# Patient Record
Sex: Male | Born: 1975
Health system: Southern US, Community
[De-identification: ages and names within clinical notes are randomized; demographics above are authoritative.]

## PROBLEM LIST (undated history)

## (undated) DIAGNOSIS — N529 Male erectile dysfunction, unspecified: Secondary | ICD-10-CM

## (undated) DIAGNOSIS — K219 Gastro-esophageal reflux disease without esophagitis: Secondary | ICD-10-CM

## (undated) DIAGNOSIS — N200 Calculus of kidney: Secondary | ICD-10-CM

## (undated) HISTORY — DX: Gastro-esophageal reflux disease without esophagitis: K21.9

## (undated) HISTORY — DX: Male erectile dysfunction, unspecified: N52.9

## (undated) HISTORY — DX: Calculus of kidney: N20.0

---

## 2011-11-30 ENCOUNTER — Encounter: Payer: Self-pay | Admitting: Internal Medicine

## 2011-11-30 DIAGNOSIS — Z0000 Encounter for general adult medical examination without abnormal findings: Secondary | ICD-10-CM | POA: Insufficient documentation

## 2011-12-01 ENCOUNTER — Other Ambulatory Visit: Payer: Self-pay | Admitting: Internal Medicine

## 2011-12-01 ENCOUNTER — Ambulatory Visit (INDEPENDENT_AMBULATORY_CARE_PROVIDER_SITE_OTHER): Payer: Managed Care, Other (non HMO) | Admitting: Internal Medicine

## 2011-12-01 ENCOUNTER — Encounter: Payer: Self-pay | Admitting: Internal Medicine

## 2011-12-01 ENCOUNTER — Other Ambulatory Visit (INDEPENDENT_AMBULATORY_CARE_PROVIDER_SITE_OTHER): Payer: Managed Care, Other (non HMO)

## 2011-12-01 VITALS — BP 100/58 | HR 71 | Temp 97.0°F | Ht 70.0 in | Wt 204.5 lb

## 2011-12-01 DIAGNOSIS — Z0000 Encounter for general adult medical examination without abnormal findings: Secondary | ICD-10-CM

## 2011-12-01 DIAGNOSIS — N529 Male erectile dysfunction, unspecified: Secondary | ICD-10-CM

## 2011-12-01 DIAGNOSIS — K219 Gastro-esophageal reflux disease without esophagitis: Secondary | ICD-10-CM | POA: Insufficient documentation

## 2011-12-01 DIAGNOSIS — N200 Calculus of kidney: Secondary | ICD-10-CM | POA: Insufficient documentation

## 2011-12-01 HISTORY — DX: Male erectile dysfunction, unspecified: N52.9

## 2011-12-01 LAB — CBC WITH DIFFERENTIAL/PLATELET
Basophils Relative: 0.2 % (ref 0.0–3.0)
Eosinophils Absolute: 0.1 10*3/uL (ref 0.0–0.7)
Hemoglobin: 16.1 g/dL (ref 13.0–17.0)
MCHC: 34.6 g/dL (ref 30.0–36.0)
MCV: 89.8 fl (ref 78.0–100.0)
Monocytes Absolute: 0.4 10*3/uL (ref 0.1–1.0)
Neutro Abs: 4.1 10*3/uL (ref 1.4–7.7)
RBC: 5.2 Mil/uL (ref 4.22–5.81)

## 2011-12-01 LAB — URINALYSIS, ROUTINE W REFLEX MICROSCOPIC
Bilirubin Urine: NEGATIVE
Hgb urine dipstick: NEGATIVE
Total Protein, Urine: NEGATIVE
Urine Glucose: NEGATIVE

## 2011-12-01 LAB — BASIC METABOLIC PANEL
Calcium: 9.3 mg/dL (ref 8.4–10.5)
Chloride: 106 mEq/L (ref 96–112)
Creatinine, Ser: 1 mg/dL (ref 0.4–1.5)
Sodium: 142 mEq/L (ref 135–145)

## 2011-12-01 LAB — LIPID PANEL
LDL Cholesterol: 122 mg/dL — ABNORMAL HIGH (ref 0–99)
Total CHOL/HDL Ratio: 4
Triglycerides: 103 mg/dL (ref 0.0–149.0)

## 2011-12-01 LAB — HEPATIC FUNCTION PANEL
ALT: 17 U/L (ref 0–53)
Bilirubin, Direct: 0.2 mg/dL (ref 0.0–0.3)
Total Bilirubin: 1.3 mg/dL — ABNORMAL HIGH (ref 0.3–1.2)

## 2011-12-01 MED ORDER — TADALAFIL 20 MG PO TABS: 20.0000 mg | ORAL_TABLET | Freq: Every day | ORAL | Status: DC | PRN

## 2011-12-01 MED ORDER — SILDENAFIL CITRATE 100 MG PO TABS: 50.0000 mg | ORAL_TABLET | Freq: Every day | ORAL | Status: DC | PRN

## 2011-12-01 NOTE — Telephone Encounter (Signed)
Pt informed rx sent.

## 2011-12-01 NOTE — Assessment & Plan Note (Signed)
Overall doing well, age appropriate education and counseling updated, referrals for preventative services and immunizations addressed, dietary and smoking counseling addressed, most recent labs and ECG reviewed.  I have personally reviewed and have noted: 1) the patient's medical and social history 2) The pt's use of alcohol, tobacco, and illicit drugs 3) The patient's current medications and supplements 4) Functional ability including ADL's, fall risk, home safety risk, hearing and visual impairment 5) Diet and physical activities 6) Evidence for depression or mood disorder 7) The patient's height, weight, and BMI have been recorded in the chart I have made referrals, and provided counseling and education based on review of the above For labs today as well

## 2011-12-01 NOTE — Telephone Encounter (Signed)
Done per emr 

## 2011-12-01 NOTE — Telephone Encounter (Signed)
Pt says his insurance desire him to try cialis before viagra---[facility] [address]--pt ph#[phone]--pt is requesting cialis be sent to his pharmacy.

## 2011-12-01 NOTE — Assessment & Plan Note (Signed)
Worsening symptoms, ? Psychological component; for testosterone check, viagra prn,  to f/u any worsening symptoms or concerns

## 2011-12-01 NOTE — Patient Instructions (Signed)
Take all new medications as prescribed Please go to LAB in the Basement for the blood and/or urine tests to be done today You will be contacted by phone if any changes need to be made immediately.  Otherwise, you will receive a letter about your results with an explanation. Please return in 1 year for your yearly visit, or sooner if needed, with Lab testing done 3-5 days before

## 2011-12-01 NOTE — Progress Notes (Signed)
Subjective:    Patient ID: Kevin Gregory, male    DOB: 12-04-1975, 36 y.o.   MRN: [id number]  HPI  Here for wellness and f/u;  Overall doing ok;  Pt denies CP, worsening SOB, DOE, wheezing, orthopnea, PND, worsening LE edema, palpitations, dizziness or syncope.  Pt denies neurological change such as new Headache, facial or extremity weakness.  Pt denies polydipsia, polyuria, or low sugar symptoms. Pt states overall good compliance with treatment and medications, good tolerability, and trying to follow lower cholesterol diet.  Pt denies worsening depressive symptoms, suicidal ideation or panic. No fever, wt loss, night sweats, loss of appetite, or other constitutional symptoms.  Pt states good ability with ADL's, low fall risk, home safety reviewed and adequate, no significant changes in hearing or vision, and occasionally active with exercise.  Main new complaint is more persistent singnificant ED symtpoms over the past 6 mo.  Has not been checked for testosterone.  Wife died 2 yrs ago, now with new girlfriend, has some stress at work.  Has ad some counseling for wife death in the past.   Past Medical History  Diagnosis Date  . Kidney stones   . Nephrolithiasis   . GERD (gastroesophageal reflux disease)    History reviewed. No pertinent past surgical history.  reports that he has quit smoking. He has never used smokeless tobacco. He reports that he drinks alcohol. He reports that he does not use illicit drugs. family history includes Drug abuse in his maternal grandmother. No Known Allergies No current outpatient prescriptions on file prior to visit.   Review of Systems Review of Systems  Constitutional: Negative for diaphoresis, activity change, appetite change and unexpected weight change.  HENT: Negative for hearing loss, ear pain, facial swelling, mouth sores and neck stiffness.   Eyes: Negative for pain, redness and visual disturbance.  Respiratory: Negative for shortness of breath and  wheezing.   Cardiovascular: Negative for chest pain and palpitations.  Gastrointestinal: Negative for diarrhea, blood in stool, abdominal distention and rectal pain.  Genitourinary: Negative for hematuria, flank pain and decreased urine volume.  Musculoskeletal: Negative for myalgias and joint swelling.  Skin: Negative for color change and wound.  Neurological: Negative for syncope and numbness.  Hematological: Negative for adenopathy.  Psychiatric/Behavioral: Negative for hallucinations, self-injury, decreased concentration and agitation.      Objective:   Physical Exam BP 100/58  Pulse 71  Temp(Src) 97 F (36.1 C) (Oral)  Ht 5\' 10"  (1.778 m)  Wt 204 lb 8 oz (92.761 kg)  BMI 29.34 kg/m2  SpO2 96% Physical Exam  VS noted Constitutional: Pt is oriented to person, place, and time. Appears well-developed and well-nourished.  HENT:  Head: Normocephalic and atraumatic.  Right Ear: External ear normal.  Left Ear: External ear normal.  Nose: Nose normal.  Mouth/Throat: Oropharynx is clear and moist.  Eyes: Conjunctivae and EOM are normal. Pupils are equal, round, and reactive to light.  Neck: Normal range of motion. Neck supple. No JVD present. No tracheal deviation present.  Cardiovascular: Normal rate, regular rhythm, normal heart sounds and intact distal pulses.   Pulmonary/Chest: Effort normal and breath sounds normal.  Abdominal: Soft. Bowel sounds are normal. There is no tenderness.  Musculoskeletal: Normal range of motion. Exhibits no edema.  Lymphadenopathy:  Has no cervical adenopathy.  Neurological: Pt is alert and oriented to person, place, and time. Pt has normal reflexes. No cranial nerve deficit.  Skin: Skin is warm and dry. No rash noted.  Psychiatric:  Has  normal mood and affect. Behavior is normal. except some tenseness overall    Assessment & Plan:

## 2011-12-02 ENCOUNTER — Encounter: Payer: Self-pay | Admitting: Internal Medicine

## 2011-12-02 LAB — TESTOSTERONE, FREE, TOTAL, SHBG: Sex Hormone Binding: 24 nmol/L (ref 13–71)

## 2012-06-01 ENCOUNTER — Other Ambulatory Visit (INDEPENDENT_AMBULATORY_CARE_PROVIDER_SITE_OTHER): Payer: Managed Care, Other (non HMO)

## 2012-06-01 ENCOUNTER — Ambulatory Visit (INDEPENDENT_AMBULATORY_CARE_PROVIDER_SITE_OTHER): Payer: Managed Care, Other (non HMO) | Admitting: Internal Medicine

## 2012-06-01 ENCOUNTER — Encounter: Payer: Self-pay | Admitting: Internal Medicine

## 2012-06-01 VITALS — BP 108/72 | HR 69 | Temp 97.1°F | Ht 70.0 in | Wt 212.2 lb

## 2012-06-01 DIAGNOSIS — K5900 Constipation, unspecified: Secondary | ICD-10-CM

## 2012-06-01 DIAGNOSIS — R109 Unspecified abdominal pain: Secondary | ICD-10-CM

## 2012-06-01 DIAGNOSIS — K219 Gastro-esophageal reflux disease without esophagitis: Secondary | ICD-10-CM

## 2012-06-01 LAB — CBC WITH DIFFERENTIAL/PLATELET
Basophils Absolute: 0 10*3/uL (ref 0.0–0.1)
Eosinophils Absolute: 0.1 10*3/uL (ref 0.0–0.7)
HCT: 48.1 % (ref 39.0–52.0)
Hemoglobin: 16.2 g/dL (ref 13.0–17.0)
Lymphs Abs: 1.4 10*3/uL (ref 0.7–4.0)
MCHC: 33.8 g/dL (ref 30.0–36.0)
Neutro Abs: 4.3 10*3/uL (ref 1.4–7.7)
RDW: 12.3 % (ref 11.5–14.6)

## 2012-06-01 LAB — URINALYSIS, ROUTINE W REFLEX MICROSCOPIC
Bilirubin Urine: NEGATIVE
Ketones, ur: NEGATIVE
Leukocytes, UA: NEGATIVE
Urobilinogen, UA: 0.2 (ref 0.0–1.0)
pH: 7.5 (ref 5.0–8.0)

## 2012-06-01 LAB — HEPATIC FUNCTION PANEL
Alkaline Phosphatase: 81 U/L (ref 39–117)
Bilirubin, Direct: 0.2 mg/dL (ref 0.0–0.3)

## 2012-06-01 LAB — BASIC METABOLIC PANEL
Calcium: 9 mg/dL (ref 8.4–10.5)
GFR: 80.17 mL/min (ref >60.00)
Sodium: 139 mEq/L (ref 135–145)

## 2012-06-01 LAB — H. PYLORI ANTIBODY, IGG: H Pylori IgG: NEGATIVE

## 2012-06-01 NOTE — Patient Instructions (Addendum)
Continue all other medications as before Please go to LAB in the Basement for the blood and/or urine tests to be done today, including the H pylori You will be contacted by phone if any changes need to be made immediately.  Otherwise, you will receive a letter about your results with an explanation. Please remember to sign up for My Chart at your earliest convenience, as this will be important to you in the future with finding out test results.

## 2012-06-04 ENCOUNTER — Encounter: Payer: Self-pay | Admitting: Internal Medicine

## 2012-06-04 DIAGNOSIS — K5900 Constipation, unspecified: Secondary | ICD-10-CM | POA: Insufficient documentation

## 2012-06-04 NOTE — Assessment & Plan Note (Addendum)
Exam most c/w prob msk pain, mild reflux and constipation, but cant r/o other;  For now for lab eval including h pylori, cont same tx , consider otc miralax,  to f/u any worsening symptoms or concerns, tylenol prn

## 2012-06-04 NOTE — Assessment & Plan Note (Signed)
For otc prilosec prn,  to f/u any worsening symptoms or concerns 

## 2012-06-04 NOTE — Assessment & Plan Note (Signed)
Midl, for otc miralax prn,  to f/u any worsening symptoms or concerns

## 2012-06-04 NOTE — Progress Notes (Signed)
  Subjective:    Patient ID: Kevin Gregory, male    DOB: Oct 22, 1975, 36 y.o.   MRN: [id number]  HPI  Here to f/u with abd pain - 3 wks mild to mod, located symetrically bilat lower side rib cage but also vague mid upper abd and sometimes lower abd, worse recently after lifting heavy weights, no radiation,  ? Worse pressure with urination in the last wk, pain worse to stand up taller, no fever, n/v; does have some occas reflux and mild constipation recently as well;  Denies illegal steroid use, other drugs, and only rare ETOH, denies STD symtpoms.  Wife died in 2010-01-06 of gastric cancer which has him concerned as well;  No prior egd/colon/gi eval.  Asks for h pylori as his wife was + and felt related to the gastric cancer.  Pt denies chest pain, increased sob or doe, wheezing, orthopnea, PND, increased LE swelling, palpitations, dizziness or syncope. Past Medical History  Diagnosis Date  . Kidney stones   . Nephrolithiasis   . GERD (gastroesophageal reflux disease)   . Erectile dysfunction 12/01/2011   No past surgical history on file.  reports that he has quit smoking. He has never used smokeless tobacco. He reports that he drinks alcohol. He reports that he does not use illicit drugs. family history includes Drug abuse in his maternal grandmother. No Known Allergies Current Outpatient Prescriptions on File Prior to Visit  Medication Sig Dispense Refill  . tadalafil (CIALIS) 20 MG tablet Take 1 tablet (20 mg total) by mouth daily as needed for erectile dysfunction.  10 tablet  11   Review of Systems  Constitutional: Negative for diaphoresis and unexpected weight change.  HENT: Negative for tinnitus.   Eyes: Negative for photophobia and visual disturbance.  Respiratory: Negative for choking and stridor.   Gastrointestinal: Negative for vomiting and blood in stool.  Genitourinary: Negative for hematuria and decreased urine volume.  Musculoskeletal: Negative for gait problem.  Skin: Negative for  color change and wound.  Neurological: Negative for tremors and numbness.  Psychiatric/Behavioral: Negative for decreased concentration. The patient is not hyperactive.       Objective:   Physical Exam BP 108/72  Pulse 69  Temp 97.1 F (36.2 C) (Oral)  Ht 5\' 10"  (1.778 m)  Wt 212 lb 4 oz (96.276 kg)  BMI 30.45 kg/m2  SpO2 96% Physical Exam  VS noted Constitutional: Pt appears well-developed and well-nourished. . Marked athletic build  HENT: Head: Normocephalic.  Right Ear: External ear normal.  Left Ear: External ear normal.  Eyes: Conjunctivae and EOM are normal. Pupils are equal, round, and reactive to light.  Neck: Normal range of motion. Neck supple.  Cardiovascular: Normal rate and regular rhythm.   Pulmonary/Chest: Effort normal and breath sounds normal.  Abd:  Soft, NT, non-distended, + BS except for lower lateral rib cage tender about the mid axillary lines at t10 bilat, and mild tender low mid abdomen without guarding or rebound Neurological: Pt is alert. Not confused  Skin: Skin is warm. No erythema.  Psychiatric: Pt behavior is normal. Thought content normal.     Assessment & Plan:

## 2012-08-07 ENCOUNTER — Telehealth: Payer: Self-pay | Admitting: *Deleted

## 2012-08-07 NOTE — Telephone Encounter (Signed)
Patient called to request to change medication Cialis to Levitra. Please call in to [facility] in [place].

## 2012-08-09 MED ORDER — VARDENAFIL HCL 20 MG PO TABS: 20.0000 mg | ORAL_TABLET | ORAL | Status: DC | PRN

## 2012-08-09 NOTE — Telephone Encounter (Signed)
Done erx 

## 2012-09-04 ENCOUNTER — Telehealth: Payer: Self-pay

## 2012-09-04 NOTE — Telephone Encounter (Signed)
Received PA Denial for Levitra please advise on change.

## 2012-09-04 NOTE — Telephone Encounter (Signed)
OK for pt to pay cash, as most patients do now for viagra/cialis/levitra

## 2012-09-05 NOTE — Telephone Encounter (Signed)
Called left message to call back 

## 2012-09-06 NOTE — Telephone Encounter (Signed)
Called the patient left message as no answer.

## 2012-09-29 ENCOUNTER — Emergency Department: Payer: Self-pay | Admitting: Emergency Medicine

## 2012-09-29 LAB — COMPREHENSIVE METABOLIC PANEL
Albumin: 4 g/dL (ref 3.4–5.0)
Calcium, Total: 8.4 mg/dL — ABNORMAL LOW (ref 8.5–10.1)
Co2: 26 mmol/L (ref 21–32)
Creatinine: 1 mg/dL (ref 0.60–1.30)
EGFR (Non-African Amer.): >60
Glucose: 110 mg/dL — ABNORMAL HIGH (ref 65–99)
Potassium: 3.6 mmol/L (ref 3.5–5.1)
SGPT (ALT): 47 U/L (ref 12–78)
Sodium: 141 mmol/L (ref 136–145)

## 2012-09-29 LAB — CBC
HCT: 45.5 % (ref 40.0–52.0)
HGB: 15.9 g/dL (ref 13.0–18.0)
MCH: 30.8 pg (ref 26.0–34.0)
MCHC: 35 g/dL (ref 32.0–36.0)
RBC: 5.17 10*6/uL (ref 4.40–5.90)
RDW: 12.4 % (ref 11.5–14.5)

## 2012-09-29 LAB — CK TOTAL AND CKMB (NOT AT ARMC): CK-MB: <0.5 ng/mL — ABNORMAL LOW (ref 0.5–3.6)

## 2012-09-29 LAB — TROPONIN I: Troponin-I: <0.02 ng/mL

## 2012-09-30 LAB — TROPONIN I: Troponin-I: <0.02 ng/mL

## 2012-12-13 ENCOUNTER — Other Ambulatory Visit: Payer: Self-pay | Admitting: Internal Medicine

## 2012-12-13 NOTE — Telephone Encounter (Signed)
refill 

## 2014-02-13 ENCOUNTER — Other Ambulatory Visit: Payer: Self-pay | Admitting: Internal Medicine

## 2015-07-02 ENCOUNTER — Other Ambulatory Visit (INDEPENDENT_AMBULATORY_CARE_PROVIDER_SITE_OTHER): Payer: Federal, State, Local not specified - PPO

## 2015-07-02 ENCOUNTER — Encounter: Payer: Self-pay | Admitting: Internal Medicine

## 2015-07-02 ENCOUNTER — Ambulatory Visit (INDEPENDENT_AMBULATORY_CARE_PROVIDER_SITE_OTHER): Payer: Federal, State, Local not specified - PPO | Admitting: Internal Medicine

## 2015-07-02 VITALS — BP 116/74 | HR 80 | Temp 98.2°F | Ht 70.0 in | Wt 229.0 lb

## 2015-07-02 DIAGNOSIS — Z0000 Encounter for general adult medical examination without abnormal findings: Secondary | ICD-10-CM

## 2015-07-02 DIAGNOSIS — N529 Male erectile dysfunction, unspecified: Secondary | ICD-10-CM

## 2015-07-02 DIAGNOSIS — R7989 Other specified abnormal findings of blood chemistry: Secondary | ICD-10-CM | POA: Diagnosis not present

## 2015-07-02 LAB — LIPID PANEL
CHOLESTEROL: 203 mg/dL — AB (ref 0–200)
HDL: 33.4 mg/dL — AB (ref >39.00)
NonHDL: 169.57
TRIGLYCERIDES: 285 mg/dL — AB (ref 0.0–149.0)
Total CHOL/HDL Ratio: 6
VLDL: 57 mg/dL — ABNORMAL HIGH (ref 0.0–40.0)

## 2015-07-02 LAB — CBC WITH DIFFERENTIAL/PLATELET
BASOS ABS: 0 10*3/uL (ref 0.0–0.1)
Basophils Relative: 0.3 % (ref 0.0–3.0)
EOS ABS: 0.1 10*3/uL (ref 0.0–0.7)
Eosinophils Relative: 1.4 % (ref 0.0–5.0)
HEMATOCRIT: 45.1 % (ref 39.0–52.0)
Hemoglobin: 15.4 g/dL (ref 13.0–17.0)
LYMPHS PCT: 27.2 % (ref 12.0–46.0)
Lymphs Abs: 2 10*3/uL (ref 0.7–4.0)
MCHC: 34.2 g/dL (ref 30.0–36.0)
MCV: 86.3 fl (ref 78.0–100.0)
MONOS PCT: 9 % (ref 3.0–12.0)
Monocytes Absolute: 0.7 10*3/uL (ref 0.1–1.0)
NEUTROS PCT: 62.1 % (ref 43.0–77.0)
Neutro Abs: 4.5 10*3/uL (ref 1.4–7.7)
Platelets: 213 10*3/uL (ref 150.0–400.0)
RBC: 5.23 Mil/uL (ref 4.22–5.81)
RDW: 12.7 % (ref 11.5–15.5)
WBC: 7.3 10*3/uL (ref 4.0–10.5)

## 2015-07-02 LAB — HEPATIC FUNCTION PANEL
ALBUMIN: 4.4 g/dL (ref 3.5–5.2)
ALT: 23 U/L (ref 0–53)
AST: 23 U/L (ref 0–37)
Alkaline Phosphatase: 91 U/L (ref 39–117)
Bilirubin, Direct: 0.1 mg/dL (ref 0.0–0.3)
TOTAL PROTEIN: 7.3 g/dL (ref 6.0–8.3)
Total Bilirubin: 0.8 mg/dL (ref 0.2–1.2)

## 2015-07-02 LAB — BASIC METABOLIC PANEL
BUN: 17 mg/dL (ref 6–23)
CALCIUM: 9.1 mg/dL (ref 8.4–10.5)
CO2: 26 meq/L (ref 19–32)
CREATININE: 1.06 mg/dL (ref 0.40–1.50)
Chloride: 104 mEq/L (ref 96–112)
GFR: 82.31 mL/min (ref >60.00)
GLUCOSE: 88 mg/dL (ref 70–99)
Potassium: 3.8 mEq/L (ref 3.5–5.1)
Sodium: 139 mEq/L (ref 135–145)

## 2015-07-02 LAB — URINALYSIS, ROUTINE W REFLEX MICROSCOPIC
Bilirubin Urine: NEGATIVE
Hgb urine dipstick: NEGATIVE
Ketones, ur: NEGATIVE
Leukocytes, UA: NEGATIVE
NITRITE: NEGATIVE
SPECIFIC GRAVITY, URINE: 1.02 (ref 1.000–1.030)
TOTAL PROTEIN, URINE-UPE24: NEGATIVE
URINE GLUCOSE: NEGATIVE
Urobilinogen, UA: 2 — AB (ref 0.0–1.0)
pH: 6.5 (ref 5.0–8.0)

## 2015-07-02 LAB — TSH: TSH: 1.7 u[IU]/mL (ref 0.35–4.50)

## 2015-07-02 LAB — PSA: PSA: 0.75 ng/mL (ref 0.10–4.00)

## 2015-07-02 LAB — LDL CHOLESTEROL, DIRECT: Direct LDL: 123 mg/dL

## 2015-07-02 MED ORDER — TADALAFIL 20 MG PO TABS: ORAL_TABLET | ORAL | Status: DC

## 2015-07-02 NOTE — Addendum Note (Signed)
Addended by: Biagio Borg on: 07/02/2015 04:26 PM   Modules accepted: Miquel Dunn

## 2015-07-02 NOTE — Progress Notes (Signed)
Subjective:    Patient ID: Kevin Gregory, male    DOB: 1975/12/18, 39 y.o.   MRN: [id number]  HPI  Here for wellness and f/u;  Overall doing ok;  Pt denies Chest pain, worsening SOB, DOE, wheezing, orthopnea, PND, worsening LE edema, palpitations, dizziness or syncope.  Pt denies neurological change such as new headache, facial or extremity weakness.  Pt denies polydipsia, polyuria, or low sugar symptoms. Pt states overall good compliance with treatment and medications, good tolerability, and has been trying to follow appropriate diet.  Pt denies worsening depressive symptoms, suicidal ideation or panic. No fever, night sweats, wt loss, loss of appetite, or other constitutional symptoms.  Pt states good ability with ADL's, has low fall risk, home safety reviewed and adequate, no other significant changes in hearing or vision, and only occasionally active with exercise.  Also with low libido recently, and some ED symptoms, possibly related to stress but not sure. DId have a separation for a week last yr from wife, and felt better, but now worse again. Does not think he needs marital counseling. 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  Not considering divorce. Now on his third marriage, with a son and stepson. Second wife died of cancer. Declines immunizations. Past Medical History  Diagnosis Date  . Kidney stones   . Nephrolithiasis   . GERD (gastroesophageal reflux disease)   . Erectile dysfunction 12/01/2011   No past surgical history on file.  reports that he has quit smoking. He has never used smokeless tobacco. He reports that he drinks alcohol. He reports that he does not use illicit drugs. family history includes Drug abuse in his maternal grandmother. No Known Allergies    Review of Systems Constitutional: Negative for increased diaphoresis, other activity, appetite or siginficant weight  change other than noted HENT: Negative for worsening hearing loss, ear pain, facial swelling, mouth sores and neck stiffness.   Eyes: Negative for other worsening pain, redness or visual disturbance.  Respiratory: Negative for shortness of breath and wheezing  Cardiovascular: Negative for chest pain and palpitations.  Gastrointestinal: Negative for diarrhea, blood in stool, abdominal distention or other pain Genitourinary: Negative for hematuria, flank pain or change in urine volume.  Musculoskeletal: Negative for myalgias or other joint complaints.  Skin: Negative for color change and wound or drainage.  Neurological: Negative for syncope and numbness. other than noted Hematological: Negative for adenopathy. or other swelling Psychiatric/Behavioral: Negative for hallucinations, SI, self-injury, decreased concentration or other worsening agitation.      Objective:   Physical Exam BP 116/74 mmHg  Pulse 80  Temp(Src) 98.2 F (36.8 C) (Oral)  Ht 5\' 10"  (1.778 m)  Wt 229 lb (103.874 kg)  BMI 32.86 kg/m2  SpO2 99% VS noted, not ill appearing, obese Constitutional: Pt is oriented to person, place, and time. Appears well-developed and well-nourished, in no significant distress Head: Normocephalic and atraumatic.  Right Ear: External ear normal.  Left Ear: External ear normal.  Nose: Nose normal.  Mouth/Throat: Oropharynx is clear and moist.  Eyes: Conjunctivae and EOM are normal. Pupils are equal, round, and reactive to light.  Neck: Normal range of motion. Neck supple. No JVD present. No tracheal deviation present or significant neck LA or mass Cardiovascular: Normal rate, regular rhythm, normal heart sounds and intact distal pulses.   Pulmonary/Chest: Effort normal and breath sounds without rales or wheezing  Abdominal: Soft. Bowel sounds are normal. NT. No HSM  Musculoskeletal: Normal range of motion. Exhibits no edema.  Lymphadenopathy:  Has no cervical adenopathy.  Neurological:  Pt is alert and oriented to person, place, and time. Pt has normal reflexes. No cranial nerve deficit. Motor grossly intact Skin: Skin is warm and dry. No rash noted.  Psychiatric:  Has mild nervous mood and affect. Behavior is normal.     Assessment & Plan:

## 2015-07-02 NOTE — Assessment & Plan Note (Signed)
Ok for cialis prn, also check testosterone, suspect symptoms may be more related to stress

## 2015-07-02 NOTE — Progress Notes (Signed)
Pre visit review using our clinic review tool, if applicable. No additional management support is needed unless otherwise documented below in the visit note. 

## 2015-07-02 NOTE — Patient Instructions (Signed)
Please continue all other medications as before, and refills have been done if requested - the cialis  Please have the pharmacy call with any other refills you may need.  Please continue your efforts at being more active, low cholesterol diet, and weight control.  You are otherwise up to date with prevention measures today.  Please keep your appointments with your specialists as you may have planned  Please go to the LAB in the Basement (turn left off the elevator) for the tests to be done today  You will be contacted by phone if any changes need to be made immediately.  Otherwise, you will receive a letter about your results with an explanation, but please check with MyChart first.  Please remember to sign up for MyChart if you have not done so, as this will be important to you in the future with finding out test results, communicating by private email, and scheduling acute appointments online when needed.  Please return in 1 year for your yearly visit, or sooner if needed, with Lab testing done 3-5 days before

## 2016-02-25 DIAGNOSIS — K080 Exfoliation of teeth due to systemic causes: Secondary | ICD-10-CM | POA: Diagnosis not present

## 2016-04-02 DIAGNOSIS — M9902 Segmental and somatic dysfunction of thoracic region: Secondary | ICD-10-CM | POA: Diagnosis not present

## 2016-04-02 DIAGNOSIS — M9903 Segmental and somatic dysfunction of lumbar region: Secondary | ICD-10-CM | POA: Diagnosis not present

## 2016-04-02 DIAGNOSIS — M545 Low back pain: Secondary | ICD-10-CM | POA: Diagnosis not present

## 2016-04-02 DIAGNOSIS — M546 Pain in thoracic spine: Secondary | ICD-10-CM | POA: Diagnosis not present

## 2016-04-05 DIAGNOSIS — M9902 Segmental and somatic dysfunction of thoracic region: Secondary | ICD-10-CM | POA: Diagnosis not present

## 2016-04-05 DIAGNOSIS — M546 Pain in thoracic spine: Secondary | ICD-10-CM | POA: Diagnosis not present

## 2016-04-05 DIAGNOSIS — M545 Low back pain: Secondary | ICD-10-CM | POA: Diagnosis not present

## 2016-04-05 DIAGNOSIS — M9903 Segmental and somatic dysfunction of lumbar region: Secondary | ICD-10-CM | POA: Diagnosis not present

## 2016-04-15 ENCOUNTER — Emergency Department
Admission: EM | Admit: 2016-04-15 | Discharge: 2016-04-15 | Disposition: A | Discharge status: Home / Self Care | Attending: Emergency Medicine | Admitting: Emergency Medicine

## 2016-04-15 ENCOUNTER — Encounter: Payer: Self-pay | Admitting: Emergency Medicine

## 2016-04-15 DIAGNOSIS — S0990XA Unspecified injury of head, initial encounter: Secondary | ICD-10-CM | POA: Diagnosis not present

## 2016-04-15 DIAGNOSIS — S0101XA Laceration without foreign body of scalp, initial encounter: Secondary | ICD-10-CM | POA: Diagnosis not present

## 2016-04-15 DIAGNOSIS — Y929 Unspecified place or not applicable: Secondary | ICD-10-CM | POA: Insufficient documentation

## 2016-04-15 DIAGNOSIS — W208XXA Other cause of strike by thrown, projected or falling object, initial encounter: Secondary | ICD-10-CM | POA: Diagnosis not present

## 2016-04-15 DIAGNOSIS — Y9389 Activity, other specified: Secondary | ICD-10-CM | POA: Insufficient documentation

## 2016-04-15 DIAGNOSIS — Z79899 Other long term (current) drug therapy: Secondary | ICD-10-CM | POA: Insufficient documentation

## 2016-04-15 DIAGNOSIS — Y990 Civilian activity done for income or pay: Secondary | ICD-10-CM | POA: Insufficient documentation

## 2016-04-15 DIAGNOSIS — Z87891 Personal history of nicotine dependence: Secondary | ICD-10-CM | POA: Diagnosis not present

## 2016-04-15 MED ORDER — ACETAMINOPHEN 325 MG PO TABS
650.0000 mg | ORAL_TABLET | Freq: Once | ORAL | Status: AC
  Administered 2016-04-15: 650 mg via ORAL
  Filled 2016-04-15: qty 2

## 2016-04-15 NOTE — Discharge Instructions (Signed)
Keep the wound clean and dry. Follow-up with your provider or a local urgent care for staple removal in 10-12 days.

## 2016-04-15 NOTE — ED Triage Notes (Signed)
Reports accident at work, lac to back of head.  bleeding controlled

## 2016-04-15 NOTE — ED Provider Notes (Signed)
[facility] Emergency Department Provider Note ____________________________________________  Time seen: 80  I have reviewed the triage vital signs and the nursing notes.  HISTORY  Chief Complaint  Laceration  HPI Kevin Gregory is a 40 y.o. male visits to the ED for evaluation of a work-related injury. He describes sustaining a laceration to the back of the scalp when he was bent over to fix something. He was given on the ground when a 5 pound radio fell of a filing cabinet in the back of the hip. He was evaluated by the medical service provider at the job site following the accident. He then drove himself home where he later took some ibuprofen. He presents now for evaluation of a laceration to the scalp. He denies any loss of consciousness, nausea, vomiting, or dizziness. He reports a mild dull headache to the back of the head but denies any other complaint at this time.  Past Medical History:  Diagnosis Date  . Erectile dysfunction 12/01/2011  . GERD (gastroesophageal reflux disease)   . Kidney stones   . Nephrolithiasis     Patient Active Problem List   Diagnosis Date Noted  . Constipation 06/04/2012  . Erectile dysfunction 12/01/2011  . Nephrolithiasis   . GERD (gastroesophageal reflux disease)   . Preventative health care 11/30/2011    History reviewed. No pertinent surgical history.  Prior to Admission medications   Medication Sig Start Date End Date Taking? Authorizing Provider  tadalafil (CIALIS) 20 MG tablet TAKE 1 TABLET (20 MG TOTAL) BY MOUTH DAILY AS NEEDED FOR ERECTILE DYSFUNCTION. 07/02/15   Biagio Borg, MD    Allergies Review of patient's allergies indicates no known allergies.  Family History  Problem Relation Age of Onset  . Drug abuse Maternal Grandmother     Social History Social History  Substance Use Topics  . Smoking status: Former Research scientist (life sciences)  . Smokeless tobacco: Never Used  . Alcohol use Yes     Comment: rare    Review of Systems  Constitutional: Negative for fever. Eyes: Negative for visual changes. Cardiovascular: Negative for chest pain. Respiratory: Negative for shortness of breath. Gastrointestinal: Negative for abdominal pain, vomiting and diarrhea. Skin: Negative for rash. Scalp lac as above.  Neurological: Negative for headaches, focal weakness or numbness. ____________________________________________  PHYSICAL EXAM:  VITAL SIGNS: ED Triage Vitals  Enc Vitals Group     BP 04/15/16 1836 124/67     Pulse Rate 04/15/16 1836 (!) 56     Resp 04/15/16 1836 18     Temp 04/15/16 1836 97.9 F (36.6 C)     Temp Source 04/15/16 1836 Oral     SpO2 04/15/16 1836 99 %     Weight 04/15/16 1837 200 lb (90.7 kg)     Height 04/15/16 1837 5\' 10"  (1.778 m)     Head Circumference --      Peak Flow --      Pain Score 04/15/16 1823 5     Pain Loc --      Pain Edu? --      Excl. in [place]? --    Constitutional: Alert and oriented. Well appearing and in no distress. Head: Normocephalic and atraumatic, except for a 2 cm linear laceration in a vertical lie to the posterior scalp. No active bleeding is noted. The wound is currently covered with a dry sterile dressing.. Eyes: Conjunctivae are normal. PERRL. Normal extraocular movements Neck: Supple. No thyromegaly. Cardiovascular: Normal rate, regular rhythm.  Respiratory: Normal respiratory  effort. No wheezes/rales/rhonchi. Musculoskeletal: Nontender with normal range of motion in all extremities.  Neurologic:  Normal gait without ataxia. Normal speech and language. No gross focal neurologic deficits are appreciated. Skin:  Skin is warm, dry and intact. No rash noted. Psychiatric: Mood and affect are normal. Patient exhibits appropriate insight and judgment. ____________________________________________  PROCEDURES  Tylenol 650 mg PO  LACERATION REPAIR Performed by: Melvenia Needles Authorized by: Melvenia Needles Consent: Verbal  consent obtained. Risks and benefits: risks, benefits and alternatives were discussed Consent given by: patient Patient identity confirmed: provided demographic data Prepped and Draped in normal sterile fashion Wound explored  Laceration Location: scalp  Laceration Length: 2 cm  No Foreign Bodies seen or palpated  Anesthesia: none   Irrigation method: syringe Amount of cleaning: standard  Skin closure: staples  Number of sutures: 3  Patient tolerance: Patient tolerated the procedure well with no immediate complications. ____________________________________________  INITIAL IMPRESSION / ASSESSMENT AND PLAN / ED COURSE  Patient with a minor posterior contusion resultant scalp laceration repair using staples. He is discharged with a wound care management and will follow-up with his companies provider as necessary. Staples will be removed in 10-12 days. A work note is provided for tomorrow as requested.   Clinical Course   ____________________________________________  FINAL CLINICAL IMPRESSION(S) / ED DIAGNOSES  Final diagnoses:  Head injury, initial encounter  Scalp laceration, initial encounter      Melvenia Needles, PA-C 04/15/16 1950    Earleen Newport, MD 04/15/16 2047

## 2016-04-15 NOTE — ED Notes (Signed)
Unable to find pts employer on profile , when inquiring about pt supervisor, pt states he just wants to be seen and not file WC

## 2016-04-15 NOTE — ED Notes (Signed)
Pt reports that while being at work today he was bent over fixing something and a 5 pound radio fell off of a filing cabinet and hit him in the back of the head  Laceration noted to head  Bleeding controlled at this time

## 2016-05-31 DIAGNOSIS — L720 Epidermal cyst: Secondary | ICD-10-CM | POA: Diagnosis not present

## 2016-06-03 DIAGNOSIS — L538 Other specified erythematous conditions: Secondary | ICD-10-CM | POA: Diagnosis not present

## 2016-06-03 DIAGNOSIS — L728 Other follicular cysts of the skin and subcutaneous tissue: Secondary | ICD-10-CM | POA: Diagnosis not present

## 2016-06-03 DIAGNOSIS — R238 Other skin changes: Secondary | ICD-10-CM | POA: Diagnosis not present

## 2016-06-07 DIAGNOSIS — L728 Other follicular cysts of the skin and subcutaneous tissue: Secondary | ICD-10-CM | POA: Diagnosis not present

## 2016-06-22 DIAGNOSIS — Z6829 Body mass index (BMI) 29.0-29.9, adult: Secondary | ICD-10-CM | POA: Diagnosis not present

## 2016-06-22 DIAGNOSIS — M5416 Radiculopathy, lumbar region: Secondary | ICD-10-CM | POA: Diagnosis not present

## 2016-07-07 DIAGNOSIS — M545 Low back pain: Secondary | ICD-10-CM | POA: Diagnosis not present

## 2016-07-12 DIAGNOSIS — M545 Low back pain: Secondary | ICD-10-CM | POA: Diagnosis not present

## 2016-07-14 DIAGNOSIS — M545 Low back pain: Secondary | ICD-10-CM | POA: Diagnosis not present

## 2016-07-19 DIAGNOSIS — M545 Low back pain: Secondary | ICD-10-CM | POA: Diagnosis not present

## 2016-08-12 DIAGNOSIS — M545 Low back pain: Secondary | ICD-10-CM | POA: Diagnosis not present

## 2016-08-17 DIAGNOSIS — M545 Low back pain: Secondary | ICD-10-CM | POA: Diagnosis not present

## 2016-08-19 DIAGNOSIS — M545 Low back pain: Secondary | ICD-10-CM | POA: Diagnosis not present

## 2016-08-31 DIAGNOSIS — M545 Low back pain: Secondary | ICD-10-CM | POA: Diagnosis not present

## 2016-09-02 DIAGNOSIS — M545 Low back pain: Secondary | ICD-10-CM | POA: Diagnosis not present

## 2016-09-09 DIAGNOSIS — M5416 Radiculopathy, lumbar region: Secondary | ICD-10-CM | POA: Diagnosis not present

## 2016-09-09 DIAGNOSIS — K080 Exfoliation of teeth due to systemic causes: Secondary | ICD-10-CM | POA: Diagnosis not present

## 2016-09-24 DIAGNOSIS — M5127 Other intervertebral disc displacement, lumbosacral region: Secondary | ICD-10-CM | POA: Diagnosis not present

## 2016-09-24 DIAGNOSIS — M5416 Radiculopathy, lumbar region: Secondary | ICD-10-CM | POA: Diagnosis not present

## 2016-09-24 DIAGNOSIS — M4726 Other spondylosis with radiculopathy, lumbar region: Secondary | ICD-10-CM | POA: Diagnosis not present

## 2016-09-24 DIAGNOSIS — M5137 Other intervertebral disc degeneration, lumbosacral region: Secondary | ICD-10-CM | POA: Diagnosis not present

## 2016-09-24 DIAGNOSIS — M5117 Intervertebral disc disorders with radiculopathy, lumbosacral region: Secondary | ICD-10-CM | POA: Diagnosis not present

## 2016-09-24 DIAGNOSIS — M5116 Intervertebral disc disorders with radiculopathy, lumbar region: Secondary | ICD-10-CM | POA: Diagnosis not present

## 2016-09-28 DIAGNOSIS — M5416 Radiculopathy, lumbar region: Secondary | ICD-10-CM | POA: Diagnosis not present

## 2016-09-28 DIAGNOSIS — M5137 Other intervertebral disc degeneration, lumbosacral region: Secondary | ICD-10-CM | POA: Diagnosis not present

## 2016-12-13 DIAGNOSIS — M9903 Segmental and somatic dysfunction of lumbar region: Secondary | ICD-10-CM | POA: Diagnosis not present

## 2016-12-13 DIAGNOSIS — M5431 Sciatica, right side: Secondary | ICD-10-CM | POA: Diagnosis not present

## 2016-12-15 DIAGNOSIS — M9903 Segmental and somatic dysfunction of lumbar region: Secondary | ICD-10-CM | POA: Diagnosis not present

## 2016-12-15 DIAGNOSIS — M5431 Sciatica, right side: Secondary | ICD-10-CM | POA: Diagnosis not present

## 2016-12-22 DIAGNOSIS — M5431 Sciatica, right side: Secondary | ICD-10-CM | POA: Diagnosis not present

## 2016-12-22 DIAGNOSIS — M9903 Segmental and somatic dysfunction of lumbar region: Secondary | ICD-10-CM | POA: Diagnosis not present

## 2017-01-05 DIAGNOSIS — M5431 Sciatica, right side: Secondary | ICD-10-CM | POA: Diagnosis not present

## 2017-01-05 DIAGNOSIS — M9903 Segmental and somatic dysfunction of lumbar region: Secondary | ICD-10-CM | POA: Diagnosis not present

## 2017-01-12 ENCOUNTER — Ambulatory Visit: Admitting: Internal Medicine

## 2017-01-12 ENCOUNTER — Encounter: Payer: Self-pay | Admitting: Internal Medicine

## 2017-01-12 VITALS — BP 128/80 | HR 77 | Ht 70.0 in | Wt 198.0 lb

## 2017-01-12 DIAGNOSIS — M5416 Radiculopathy, lumbar region: Secondary | ICD-10-CM | POA: Insufficient documentation

## 2017-01-12 MED ORDER — PREDNISONE 10 MG PO TABS: ORAL_TABLET | ORAL | 0 refills | Status: DC

## 2017-01-12 MED ORDER — GABAPENTIN 100 MG PO CAPS: 100.0000 mg | ORAL_CAPSULE | Freq: Three times a day (TID) | ORAL | 0 refills | Status: DC

## 2017-01-12 MED ORDER — TRAMADOL HCL ER 200 MG PO TB24: 200.0000 mg | ORAL_TABLET | Freq: Every day | ORAL | 3 refills | Status: DC

## 2017-01-12 MED ORDER — GABAPENTIN 300 MG PO CAPS: 300.0000 mg | ORAL_CAPSULE | Freq: Three times a day (TID) | ORAL | 5 refills | Status: DC

## 2017-01-12 NOTE — Patient Instructions (Addendum)
Please take all new medication as prescribed - the tramadol for pain, prednisone, and gabapentin as prescribed  Please start the gabapentin at 100 mg three times per day for 1 week, then move up to 300 mg three times per day  We do sometimes give 2 pills at bedtime if you think this would help, but start as above  Please continue all other medications as before, and refills have been done if requested.  Please have the pharmacy call with any other refills you may need.  Please keep your appointments with your specialists as you may have planned  You will be contacted regarding the referral for: Pain management (or you call us to cancel this if you are able to be seen sooner in July at the [facility] related pain management)

## 2017-01-12 NOTE — Progress Notes (Signed)
   Subjective:    Patient ID: Kevin Gregory, male    DOB: [birth year], 41 y.o.   MRN: [id number]  HPI  Here to f/u, has persistent known right radiculitis pain, has been evaluated per NS at [place] Sep 09, 2016 per [facility] - summary recommdneations; DIAGNOSIS:  1. Right Lumbar Radiculitis: - At this time, patient has no evidenc of red flag signs or alarming signs/symptoms - still not improved from PT and conservative measures - No myelopathy or motor weakness - MRI Lumbar Spine  - schedule lumbar epidural steroid injection   Pain overall began after lifting wts at the gym some months ago.  Has had long course of evaluation and tx including PT, ESI but pain persists, mod to severe, worse to sitting and walking or stand x 10 min. Lying down is better.  Pt has resisted strongly taking anything for pain, always hoping for improvement, but can longer put up with this, has lower mood, lower appetite and wife states 30 lb wt loss.  Currently working in the [employer] at [employer] in [place] which is light duty.  Pt states has been referred to pain management per [facility] in July, but if can done here sooner or more locally he would be amenable.   Past Medical History:  Diagnosis Date  . Erectile dysfunction 12/01/2011  . GERD (gastroesophageal reflux disease)   . Kidney stones   . Nephrolithiasis    No past surgical history on file.  reports that he has quit smoking. He has never used smokeless tobacco. He reports that he drinks alcohol. He reports that he does not use drugs. family history includes Drug abuse in his maternal grandmother. No Known Allergies Current Outpatient Prescriptions on File Prior to Visit  Medication Sig Dispense Refill  . tadalafil (CIALIS) 20 MG tablet TAKE 1 TABLET (20 MG TOTAL) BY MOUTH DAILY AS NEEDED FOR ERECTILE DYSFUNCTION. 10 tablet 11   No current facility-administered medications on file prior to visit.    Review of Systems  All otherwise neg per pt      Objective:   Physical Exam BP 128/80   Pulse 77   Ht 5\' 10"  (1.778 m)   Wt 198 lb (89.8 kg)   SpO2 99%   BMI 28.41 kg/m  VS noted,  Constitutional: Pt appears in NAD HENT: Head: NCAT.  Right Ear: External ear normal.  Left Ear: External ear normal.  Eyes: . Pupils are equal, round, and reactive to light. Conjunctivae and EOM are normal Nose: without d/c or deformity Neck: Neck supple. Gross normal ROM Cardiovascular: Normal rate and regular rhythm.   Pulmonary/Chest: Effort normal and breath sounds without rales or wheezing.  Abd:  Soft, NT, ND, + BS, no organomegaly Spine nontender Neurological: Pt is alert. At baseline orientation, motor grossly intact, cn 2-12 intact Skin: Skin is warm. No rashes, other new lesions, no LE edema Psychiatric: Pt behavior is normal without agitation  No other exam findings    Assessment & Plan:

## 2017-01-14 NOTE — Assessment & Plan Note (Signed)
Chronic persistent, for tramadol prn, predpac asd, and trial gabapentin asd (100 tid for 1 wk, then 300 tid), also refer pain management per pt request

## 2017-01-19 DIAGNOSIS — M9903 Segmental and somatic dysfunction of lumbar region: Secondary | ICD-10-CM | POA: Diagnosis not present

## 2017-01-19 DIAGNOSIS — M5431 Sciatica, right side: Secondary | ICD-10-CM | POA: Diagnosis not present

## 2018-02-03 ENCOUNTER — Encounter: Payer: Self-pay | Admitting: Internal Medicine

## 2018-02-03 ENCOUNTER — Ambulatory Visit: Payer: Federal, State, Local not specified - PPO | Admitting: Internal Medicine

## 2018-02-03 DIAGNOSIS — T07XXXA Unspecified multiple injuries, initial encounter: Secondary | ICD-10-CM
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
## 2018-02-03 NOTE — Assessment & Plan Note (Signed)
D/w pt natural history, no specific tx needed, no concern for DVT or PE at this time, should be improved in 2-3 wks

## 2018-02-03 NOTE — Patient Instructions (Signed)
Your subcutaneous hematomas are healing as expected and should take only 2-4 wks to be essentially resolved  Please continue all other medications as before, and refills have been done if requested.  Please have the pharmacy call with any other refills you may need.  Please continue your efforts at being more active, low cholesterol diet, and weight control.  Please keep your appointments with your specialists as you may have planned

## 2018-02-16 DIAGNOSIS — K080 Exfoliation of teeth due to systemic causes: Secondary | ICD-10-CM | POA: Diagnosis not present

## 2018-08-21 DIAGNOSIS — K080 Exfoliation of teeth due to systemic causes: Secondary | ICD-10-CM | POA: Diagnosis not present

## 2018-10-05 ENCOUNTER — Encounter: Payer: Self-pay | Admitting: Internal Medicine

## 2018-10-05 ENCOUNTER — Ambulatory Visit: Payer: Federal, State, Local not specified - PPO | Admitting: Internal Medicine

## 2018-10-05 DIAGNOSIS — H6691 Otitis media, unspecified, right ear: Secondary | ICD-10-CM | POA: Diagnosis not present

## 2018-10-05 DIAGNOSIS — J309 Allergic rhinitis, unspecified: Secondary | ICD-10-CM | POA: Diagnosis not present

## 2018-10-05 MED ORDER — AZITHROMYCIN 250 MG PO TABS: ORAL_TABLET | ORAL | 1 refills | Status: DC

## 2018-10-05 MED ORDER — PREDNISONE 10 MG PO TABS: ORAL_TABLET | ORAL | 0 refills | Status: DC

## 2018-10-05 NOTE — Progress Notes (Signed)
Subjective:    Patient ID: Kevin Gregory, male    DOB: [birth year], 43 y.o.   MRN: [id number]  HPI   Here with 5 days acute onset fever, right ear pain, pressure, headache, general weakness and malaise, with mild ST bu no cough, but pt denies chest pain, wheezing, increased sob or doe, orthopnea, PND, increased LE swelling, palpitations, dizziness or syncope.  Pt denies new neurological symptoms such as new headache, or facial or extremity weakness or numbness   Pt denies polydipsia, polyuria.  Does have several wks ongoing nasal allergy symptoms with clearish congestion, itch and sneezing, without fever, pain, ST, cough, swelling or wheezing. Past Medical History:  Diagnosis Date  . Erectile dysfunction 12/01/2011  . GERD (gastroesophageal reflux disease)   . Kidney stones   . Nephrolithiasis    No past surgical history on file.  reports that he has quit smoking. He has never used smokeless tobacco. He reports current alcohol use. He reports that he does not use drugs. family history includes Drug abuse in his maternal grandmother. No Known Allergies Current Outpatient Medications on File Prior to Visit  Medication Sig Dispense Refill  . gabapentin (NEURONTIN) 100 MG capsule Take 1 capsule (100 mg total) by mouth 3 (three) times daily. 21 capsule 0  . gabapentin (NEURONTIN) 300 MG capsule Take 1 capsule (300 mg total) by mouth 3 (three) times daily. 90 capsule 5  . tadalafil (CIALIS) 20 MG tablet TAKE 1 TABLET (20 MG TOTAL) BY MOUTH DAILY AS NEEDED FOR ERECTILE DYSFUNCTION. 10 tablet 11  . traMADol (ULTRAM-ER) 200 MG 24 hr tablet Take 1 tablet (200 mg total) by mouth daily. 90 tablet 3   No current facility-administered medications on file prior to visit.    Review of Systems  Constitutional: Negative for other unusual diaphoresis or sweats HENT: Negative for ear discharge or swelling Eyes: Negative for other worsening visual disturbances Respiratory: Negative for stridor or other  swelling  Gastrointestinal: Negative for worsening distension or other blood Genitourinary: Negative for retention or other urinary change Musculoskeletal: Negative for other MSK pain or swelling Skin: Negative for color change or other new lesions Neurological: Negative for worsening tremors and other numbness  Psychiatric/Behavioral: Negative for worsening agitation or other fatigue All other system neg per pt    Objective:   Physical Exam BP 114/84   Pulse (!) 118   Temp 98 F (36.7 C) (Oral)   Ht 5\' 10"  (1.778 m)   Wt 241 lb (109.3 kg)   SpO2 95%   BMI 34.58 kg/m  VS noted,  Constitutional: Pt appears in NAD HENT: Head: NCAT.  Right Ear: External ear normal.  Left Ear: External ear normal.  Eyes: . Pupils are equal, round, and reactive to light. Conjunctivae and EOM are normal Right tm's with severe erythema.  Max sinus areas non tender.  Pharynx with mild erythema, no exudate Nose: without d/c or deformity Neck: Neck supple. Gross normal ROM Cardiovascular: Normal rate and regular rhythm.   Pulmonary/Chest: Effort normal and breath sounds without rales or wheezing.  Neurological: Pt is alert. At baseline orientation, motor grossly intact Skin: Skin is warm. No rashes, other new lesions, no LE edema Psychiatric: Pt behavior is normal without agitation  No other exam findings Lab Results  Component Value Date   WBC 7.3 07/02/2015   HGB 15.4 07/02/2015   HCT 45.1 07/02/2015   PLT 213.0 07/02/2015   GLUCOSE 88 07/02/2015   CHOL 203 (H) 07/02/2015  TRIG 285.0 (H) 07/02/2015   HDL 33.40 (L) 07/02/2015   LDLDIRECT 123.0 07/02/2015   LDLCALC 122 (H) 12/01/2011   ALT 23 07/02/2015   AST 23 07/02/2015   NA 139 07/02/2015   K 3.8 07/02/2015   CL 104 07/02/2015   CREATININE 1.06 07/02/2015   BUN 17 07/02/2015   CO2 26 07/02/2015   TSH 1.70 07/02/2015   PSA 0.75 07/02/2015       Assessment & Plan:

## 2018-10-05 NOTE — Patient Instructions (Signed)
Please take all new medication as prescribed - the antibiotic and prednisone  You can also take Mucinex (or it's generic off brand) for congestion, nasacort for nasal and sinus congestion, and sudafed and tylenol as needed for pain and congestion  Please continue all other medications as before, and refills have been done if requested.  Please have the pharmacy call with any other refills you may need.  Please keep your appointments with your specialists as you may have planned

## 2018-10-05 NOTE — Assessment & Plan Note (Signed)
Mild to mod, for nasacort asd, mucinex otc prn,  to f/u any worsening symptoms or concerns

## 2018-10-05 NOTE — Assessment & Plan Note (Signed)
Mild to mod, for antibx course,  to f/u any worsening symptoms or concerns 

## 2019-02-22 DIAGNOSIS — K080 Exfoliation of teeth due to systemic causes: Secondary | ICD-10-CM | POA: Diagnosis not present

## 2019-06-23 DIAGNOSIS — J069 Acute upper respiratory infection, unspecified: Secondary | ICD-10-CM | POA: Diagnosis not present

## 2019-06-23 DIAGNOSIS — Z20828 Contact with and (suspected) exposure to other viral communicable diseases: Secondary | ICD-10-CM | POA: Diagnosis not present

## 2019-09-30 ENCOUNTER — Emergency Department: Payer: Federal, State, Local not specified - PPO

## 2019-09-30 ENCOUNTER — Other Ambulatory Visit: Payer: Self-pay

## 2019-09-30 ENCOUNTER — Observation Stay
Admission: EM | Admit: 2019-09-30 | Discharge: 2019-10-02 | Disposition: A | Discharge status: Home / Self Care | Payer: Federal, State, Local not specified - PPO | Attending: General Surgery | Admitting: General Surgery

## 2019-09-30 DIAGNOSIS — R1031 Right lower quadrant pain: Secondary | ICD-10-CM | POA: Diagnosis not present

## 2019-09-30 DIAGNOSIS — Z7952 Long term (current) use of systemic steroids: Secondary | ICD-10-CM | POA: Insufficient documentation

## 2019-09-30 DIAGNOSIS — K219 Gastro-esophageal reflux disease without esophagitis: Secondary | ICD-10-CM | POA: Diagnosis not present

## 2019-09-30 DIAGNOSIS — Z6832 Body mass index (BMI) 32.0-32.9, adult: Secondary | ICD-10-CM | POA: Diagnosis not present

## 2019-09-30 DIAGNOSIS — Z79899 Other long term (current) drug therapy: Secondary | ICD-10-CM | POA: Insufficient documentation

## 2019-09-30 DIAGNOSIS — E669 Obesity, unspecified: Secondary | ICD-10-CM | POA: Diagnosis not present

## 2019-09-30 DIAGNOSIS — K3589 Other acute appendicitis without perforation or gangrene: Principal | ICD-10-CM | POA: Insufficient documentation

## 2019-09-30 DIAGNOSIS — K3580 Unspecified acute appendicitis: Secondary | ICD-10-CM | POA: Diagnosis not present

## 2019-09-30 DIAGNOSIS — R112 Nausea with vomiting, unspecified: Secondary | ICD-10-CM | POA: Diagnosis not present

## 2019-09-30 DIAGNOSIS — Z87442 Personal history of urinary calculi: Secondary | ICD-10-CM | POA: Diagnosis not present

## 2019-09-30 DIAGNOSIS — Z20822 Contact with and (suspected) exposure to covid-19: Secondary | ICD-10-CM | POA: Diagnosis not present

## 2019-09-30 DIAGNOSIS — K760 Fatty (change of) liver, not elsewhere classified: Secondary | ICD-10-CM | POA: Diagnosis not present

## 2019-09-30 DIAGNOSIS — R109 Unspecified abdominal pain: Secondary | ICD-10-CM | POA: Diagnosis not present

## 2019-09-30 DIAGNOSIS — Z87891 Personal history of nicotine dependence: Secondary | ICD-10-CM | POA: Diagnosis not present

## 2019-09-30 LAB — COMPREHENSIVE METABOLIC PANEL
ALT: 23 U/L (ref 0–44)
AST: 22 U/L (ref 15–41)
Albumin: 4.5 g/dL (ref 3.5–5.0)
Alkaline Phosphatase: 93 U/L (ref 38–126)
Anion gap: 11 (ref 5–15)
BUN: 9 mg/dL (ref 6–20)
CO2: 26 mmol/L (ref 22–32)
Calcium: 9.4 mg/dL (ref 8.9–10.3)
Chloride: 102 mmol/L (ref 98–111)
Creatinine, Ser: 1.11 mg/dL (ref 0.61–1.24)
GFR calc Af Amer: >60 mL/min (ref >60)
GFR calc non Af Amer: >60 mL/min (ref >60)
Glucose, Bld: 131 mg/dL — ABNORMAL HIGH (ref 70–99)
Potassium: 4.1 mmol/L (ref 3.5–5.1)
Sodium: 139 mmol/L (ref 135–145)
Total Bilirubin: 1.2 mg/dL (ref 0.3–1.2)
Total Protein: 8.1 g/dL (ref 6.5–8.1)

## 2019-09-30 LAB — LIPASE, BLOOD: Lipase: 27 U/L (ref 11–51)

## 2019-09-30 LAB — URINALYSIS, COMPLETE (UACMP) WITH MICROSCOPIC
Bacteria, UA: NONE SEEN
Bilirubin Urine: NEGATIVE
Glucose, UA: NEGATIVE mg/dL
Hgb urine dipstick: NEGATIVE
Ketones, ur: 20 mg/dL — AB
Leukocytes,Ua: NEGATIVE
Nitrite: NEGATIVE
Protein, ur: 30 mg/dL — AB
Specific Gravity, Urine: 1.028 (ref 1.005–1.030)
pH: 5 (ref 5.0–8.0)

## 2019-09-30 LAB — CBC
HCT: 46.7 % (ref 39.0–52.0)
Hemoglobin: 16.2 g/dL (ref 13.0–17.0)
MCH: 29.4 pg (ref 26.0–34.0)
MCHC: 34.7 g/dL (ref 30.0–36.0)
MCV: 84.8 fL (ref 80.0–100.0)
Platelets: 245 10*3/uL (ref 150–400)
RBC: 5.51 MIL/uL (ref 4.22–5.81)
RDW: 11.7 % (ref 11.5–15.5)
WBC: 12.5 10*3/uL — ABNORMAL HIGH (ref 4.0–10.5)
nRBC: 0 % (ref 0.0–0.2)

## 2019-09-30 MED ORDER — PIPERACILLIN-TAZOBACTAM 3.375 G IVPB 30 MIN
3.3750 g | Freq: Once | INTRAVENOUS | Status: AC
  Administered 2019-09-30: 3.375 g via INTRAVENOUS
  Filled 2019-09-30: qty 50

## 2019-09-30 MED ORDER — ONDANSETRON HCL 4 MG/2ML IJ SOLN: 4.0000 mg | Freq: Four times a day (QID) | INTRAMUSCULAR | Status: DC | PRN

## 2019-09-30 MED ORDER — OXYCODONE HCL 5 MG PO TABS: 5.0000 mg | ORAL_TABLET | ORAL | Status: DC | PRN

## 2019-09-30 MED ORDER — SIMETHICONE 80 MG PO CHEW
40.0000 mg | CHEWABLE_TABLET | Freq: Four times a day (QID) | ORAL | Status: DC | PRN
  Filled 2019-09-30: qty 1

## 2019-09-30 MED ORDER — KETOROLAC TROMETHAMINE 30 MG/ML IJ SOLN
30.0000 mg | Freq: Four times a day (QID) | INTRAMUSCULAR | Status: DC | PRN
  Administered 2019-10-01: 30 mg via INTRAVENOUS
  Filled 2019-09-30: qty 1

## 2019-09-30 MED ORDER — HYDROMORPHONE HCL 1 MG/ML IJ SOLN: 0.5000 mg | INTRAMUSCULAR | Status: DC | PRN

## 2019-09-30 MED ORDER — ONDANSETRON 4 MG PO TBDP: 4.0000 mg | ORAL_TABLET | Freq: Four times a day (QID) | ORAL | Status: DC | PRN

## 2019-09-30 MED ORDER — IOHEXOL 300 MG/ML  SOLN
125.0000 mL | Freq: Once | INTRAMUSCULAR | Status: AC | PRN
  Administered 2019-09-30: 150 mL via INTRAVENOUS

## 2019-09-30 MED ORDER — ACETAMINOPHEN 500 MG PO TABS
1000.0000 mg | ORAL_TABLET | Freq: Four times a day (QID) | ORAL | Status: DC
  Administered 2019-10-01 – 2019-10-02 (×3): 1000 mg via ORAL
  Filled 2019-09-30 (×4): qty 2

## 2019-09-30 MED ORDER — MORPHINE SULFATE (PF) 4 MG/ML IV SOLN
4.0000 mg | Freq: Once | INTRAVENOUS | Status: AC
  Administered 2019-09-30: 4 mg via INTRAVENOUS
  Filled 2019-09-30: qty 1

## 2019-09-30 MED ORDER — SODIUM CHLORIDE 0.9 % IV BOLUS
1000.0000 mL | Freq: Once | INTRAVENOUS | Status: AC
  Administered 2019-09-30: 1000 mL via INTRAVENOUS

## 2019-09-30 MED ORDER — GABAPENTIN 300 MG PO CAPS
300.0000 mg | ORAL_CAPSULE | Freq: Three times a day (TID) | ORAL | Status: DC
  Administered 2019-10-01 – 2019-10-02 (×4): 300 mg via ORAL
  Filled 2019-09-30 (×5): qty 1

## 2019-09-30 MED ORDER — PIPERACILLIN-TAZOBACTAM 3.375 G IVPB
3.3750 g | Freq: Three times a day (TID) | INTRAVENOUS | Status: DC
  Administered 2019-10-01 – 2019-10-02 (×4): 3.375 g via INTRAVENOUS
  Filled 2019-09-30 (×4): qty 50

## 2019-09-30 MED ORDER — ONDANSETRON HCL 4 MG/2ML IJ SOLN
4.0000 mg | Freq: Once | INTRAMUSCULAR | Status: AC
  Administered 2019-09-30: 4 mg via INTRAVENOUS
  Filled 2019-09-30: qty 2

## 2019-09-30 NOTE — Progress Notes (Signed)
Briefly, this is a 44 y/o M with 24 hours of abdominal pain, n/v.  Presented to the ED and was found to have mildly elevated WBC, CT scan c/w acute appendicitis.  Will admit with plans for lap appy tomorrow, pending OR and anesthesia availability.  Full H&P to follow.
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
## 2019-09-30 NOTE — ED Triage Notes (Signed)
Patient c/o [id number] abdominal pain, emesis, and diarrhea beginning last night about an hour after eating dinner.

## 2019-10-01 ENCOUNTER — Encounter: Payer: Self-pay | Admitting: General Surgery

## 2019-10-01 ENCOUNTER — Observation Stay: Payer: Federal, State, Local not specified - PPO | Admitting: Anesthesiology

## 2019-10-01 ENCOUNTER — Encounter
Admission: EM | Disposition: A | Discharge status: Home / Self Care | Payer: Self-pay | Source: Home / Self Care | Attending: Student

## 2019-10-01 DIAGNOSIS — K3580 Unspecified acute appendicitis: Secondary | ICD-10-CM | POA: Diagnosis not present

## 2019-10-01 DIAGNOSIS — K37 Unspecified appendicitis: Secondary | ICD-10-CM | POA: Diagnosis not present

## 2019-10-01 HISTORY — PX: LAPAROSCOPIC APPENDECTOMY: SHX408

## 2019-10-01 LAB — RESPIRATORY PANEL BY RT PCR (FLU A&B, COVID)
Influenza A by PCR: NEGATIVE
Influenza B by PCR: NEGATIVE
SARS Coronavirus 2 by RT PCR: NEGATIVE

## 2019-10-01 LAB — BASIC METABOLIC PANEL
Anion gap: 7 (ref 5–15)
BUN: 9 mg/dL (ref 6–20)
CO2: 27 mmol/L (ref 22–32)
Calcium: 8.5 mg/dL — ABNORMAL LOW (ref 8.9–10.3)
Chloride: 105 mmol/L (ref 98–111)
Creatinine, Ser: 1.1 mg/dL (ref 0.61–1.24)
GFR calc Af Amer: >60 mL/min (ref >60)
GFR calc non Af Amer: >60 mL/min (ref >60)
Glucose, Bld: 149 mg/dL — ABNORMAL HIGH (ref 70–99)
Potassium: 3.4 mmol/L — ABNORMAL LOW (ref 3.5–5.1)
Sodium: 139 mmol/L (ref 135–145)

## 2019-10-01 LAB — CBC
HCT: 41.8 % (ref 39.0–52.0)
Hemoglobin: 14.4 g/dL (ref 13.0–17.0)
MCH: 29.4 pg (ref 26.0–34.0)
MCHC: 34.4 g/dL (ref 30.0–36.0)
MCV: 85.3 fL (ref 80.0–100.0)
Platelets: 207 10*3/uL (ref 150–400)
RBC: 4.9 MIL/uL (ref 4.22–5.81)
RDW: 11.9 % (ref 11.5–15.5)
WBC: 10.8 10*3/uL — ABNORMAL HIGH (ref 4.0–10.5)
nRBC: 0 % (ref 0.0–0.2)

## 2019-10-01 LAB — PHOSPHORUS: Phosphorus: 3.1 mg/dL (ref 2.5–4.6)

## 2019-10-01 LAB — MRSA PCR SCREENING: MRSA by PCR: NEGATIVE

## 2019-10-01 LAB — MAGNESIUM: Magnesium: 2 mg/dL (ref 1.7–2.4)

## 2019-10-01 SURGERY — APPENDECTOMY, LAPAROSCOPIC
Anesthesia: General

## 2019-10-01 MED ORDER — DEXAMETHASONE SODIUM PHOSPHATE 10 MG/ML IJ SOLN
INTRAMUSCULAR | Status: DC | PRN
  Administered 2019-10-01: 10 mg via INTRAVENOUS

## 2019-10-01 MED ORDER — ROCURONIUM BROMIDE 100 MG/10ML IV SOLN
INTRAVENOUS | Status: DC | PRN
  Administered 2019-10-01: 30 mg via INTRAVENOUS
  Administered 2019-10-01: 50 mg via INTRAVENOUS
  Administered 2019-10-01: 20 mg via INTRAVENOUS

## 2019-10-01 MED ORDER — LIDOCAINE HCL (CARDIAC) PF 100 MG/5ML IV SOSY
PREFILLED_SYRINGE | INTRAVENOUS | Status: DC | PRN
  Administered 2019-10-01: 100 mg via INTRAVENOUS

## 2019-10-01 MED ORDER — MIDAZOLAM HCL 2 MG/2ML IJ SOLN
INTRAMUSCULAR | Status: AC
  Filled 2019-10-01: qty 2

## 2019-10-01 MED ORDER — LIDOCAINE HCL (PF) 2 % IJ SOLN
INTRAMUSCULAR | Status: AC
  Filled 2019-10-01: qty 10

## 2019-10-01 MED ORDER — ACETAMINOPHEN 10 MG/ML IV SOLN
INTRAVENOUS | Status: DC | PRN
  Administered 2019-10-01: 1000 mg via INTRAVENOUS

## 2019-10-01 MED ORDER — MIDAZOLAM HCL 2 MG/2ML IJ SOLN
INTRAMUSCULAR | Status: DC | PRN
  Administered 2019-10-01: 2 mg via INTRAVENOUS

## 2019-10-01 MED ORDER — PROPOFOL 10 MG/ML IV BOLUS
INTRAVENOUS | Status: AC
  Filled 2019-10-01: qty 20

## 2019-10-01 MED ORDER — SUGAMMADEX SODIUM 200 MG/2ML IV SOLN
INTRAVENOUS | Status: AC
  Filled 2019-10-01: qty 2

## 2019-10-01 MED ORDER — DEXAMETHASONE SODIUM PHOSPHATE 10 MG/ML IJ SOLN
INTRAMUSCULAR | Status: AC
  Filled 2019-10-01: qty 1

## 2019-10-01 MED ORDER — SEVOFLURANE IN SOLN
RESPIRATORY_TRACT | Status: AC
  Filled 2019-10-01: qty 250

## 2019-10-01 MED ORDER — ONDANSETRON HCL 4 MG/2ML IJ SOLN
INTRAMUSCULAR | Status: DC | PRN
  Administered 2019-10-01: 4 mg via INTRAVENOUS

## 2019-10-01 MED ORDER — ATROPINE SULFATE 1 MG/10ML IJ SOSY
PREFILLED_SYRINGE | INTRAMUSCULAR | Status: AC
  Filled 2019-10-01: qty 10

## 2019-10-01 MED ORDER — ACETAMINOPHEN 10 MG/ML IV SOLN
INTRAVENOUS | Status: AC
  Filled 2019-10-01: qty 100

## 2019-10-01 MED ORDER — FENTANYL CITRATE (PF) 100 MCG/2ML IJ SOLN
INTRAMUSCULAR | Status: AC
  Filled 2019-10-01: qty 2

## 2019-10-01 MED ORDER — LACTATED RINGERS IV SOLN: INTRAVENOUS | Status: DC | PRN

## 2019-10-01 MED ORDER — MEPERIDINE HCL 50 MG/ML IJ SOLN: 6.2500 mg | INTRAMUSCULAR | Status: DC | PRN

## 2019-10-01 MED ORDER — PHENYLEPHRINE HCL (PRESSORS) 10 MG/ML IV SOLN
INTRAVENOUS | Status: DC | PRN
  Administered 2019-10-01 (×2): 100 ug via INTRAVENOUS

## 2019-10-01 MED ORDER — LIDOCAINE-EPINEPHRINE (PF) 1 %-1:200000 IJ SOLN
INTRAMUSCULAR | Status: DC | PRN
  Administered 2019-10-01: 11 mL

## 2019-10-01 MED ORDER — ROCURONIUM BROMIDE 50 MG/5ML IV SOLN
INTRAVENOUS | Status: AC
  Filled 2019-10-01: qty 1

## 2019-10-01 MED ORDER — PROPOFOL 10 MG/ML IV BOLUS
INTRAVENOUS | Status: DC | PRN
  Administered 2019-10-01: 200 mg via INTRAVENOUS

## 2019-10-01 MED ORDER — SUGAMMADEX SODIUM 200 MG/2ML IV SOLN
INTRAVENOUS | Status: DC | PRN
  Administered 2019-10-01: 300 mg via INTRAVENOUS

## 2019-10-01 MED ORDER — FENTANYL CITRATE (PF) 100 MCG/2ML IJ SOLN: 25.0000 ug | INTRAMUSCULAR | Status: DC | PRN

## 2019-10-01 MED ORDER — EPINEPHRINE PF 1 MG/ML IJ SOLN
INTRAMUSCULAR | Status: AC
  Filled 2019-10-01: qty 1

## 2019-10-01 MED ORDER — KCL-LACTATED RINGERS-D5W 20 MEQ/L IV SOLN
INTRAVENOUS | Status: DC
  Filled 2019-10-01: qty 1000

## 2019-10-01 MED ORDER — ONDANSETRON HCL 4 MG/2ML IJ SOLN
INTRAMUSCULAR | Status: AC
  Filled 2019-10-01: qty 2

## 2019-10-01 MED ORDER — "VISTASEAL 4 ML SINGLE DOSE KIT "
PACK | CUTANEOUS | Status: DC | PRN
  Administered 2019-10-01: 4 mL via TOPICAL

## 2019-10-01 MED ORDER — DEXTROSE IN LACTATED RINGERS 5 % IV SOLN: INTRAVENOUS | Status: DC

## 2019-10-01 MED ORDER — OXYCODONE HCL 5 MG/5ML PO SOLN: 5.0000 mg | Freq: Once | ORAL | Status: DC | PRN

## 2019-10-01 MED ORDER — LIDOCAINE HCL (PF) 1 % IJ SOLN
INTRAMUSCULAR | Status: AC
  Filled 2019-10-01: qty 30

## 2019-10-01 MED ORDER — PROMETHAZINE HCL 25 MG/ML IJ SOLN: 6.2500 mg | INTRAMUSCULAR | Status: DC | PRN

## 2019-10-01 MED ORDER — OXYCODONE HCL 5 MG PO TABS: 5.0000 mg | ORAL_TABLET | Freq: Once | ORAL | Status: DC | PRN

## 2019-10-01 MED ORDER — FENTANYL CITRATE (PF) 100 MCG/2ML IJ SOLN
INTRAMUSCULAR | Status: DC | PRN
  Administered 2019-10-01 (×3): 50 ug via INTRAVENOUS

## 2019-10-01 MED ORDER — POTASSIUM CHLORIDE IN NACL 20-0.9 MEQ/L-% IV SOLN
INTRAVENOUS | Status: DC
  Filled 2019-10-01 (×5): qty 1000

## 2019-10-01 MED ORDER — BUPIVACAINE HCL (PF) 0.25 % IJ SOLN
INTRAMUSCULAR | Status: AC
  Filled 2019-10-01: qty 30

## 2019-10-01 MED ORDER — SODIUM CHLORIDE 0.9 % IV SOLN
INTRAVENOUS | Status: DC | PRN
  Administered 2019-10-01: 250 mL via INTRAVENOUS

## 2019-10-01 SURGICAL SUPPLY — 52 items
APPLICATOR COTTON TIP 6 STRL (MISCELLANEOUS) IMPLANT
APPLICATOR COTTON TIP 6IN STRL (MISCELLANEOUS)
APPLIER CLIP 5 13 M/L LIGAMAX5 (MISCELLANEOUS) ×2
BLADE SURG SZ11 CARB STEEL (BLADE) ×2 IMPLANT
CANISTER SUCT 1200ML W/VALVE (MISCELLANEOUS) ×2 IMPLANT
CHLORAPREP W/TINT 26 (MISCELLANEOUS) ×2 IMPLANT
CLIP APPLIE 5 13 M/L LIGAMAX5 (MISCELLANEOUS) ×1 IMPLANT
COVER WAND RF STERILE (DRAPES) ×2 IMPLANT
CUTTER FLEX LINEAR 45M (STAPLE) ×2 IMPLANT
DEFOGGER SCOPE WARMER CLEARIFY (MISCELLANEOUS) ×2 IMPLANT
DERMABOND ADVANCED (GAUZE/BANDAGES/DRESSINGS) ×1
DERMABOND ADVANCED .7 DNX12 (GAUZE/BANDAGES/DRESSINGS) ×1 IMPLANT
ELECT CAUTERY BLADE 6.4 (BLADE) ×2 IMPLANT
ELECT CAUTERY BLADE TIP 2.5 (TIP) ×2
ELECT REM PT RETURN 9FT ADLT (ELECTROSURGICAL) ×2
ELECTRODE CAUTERY BLDE TIP 2.5 (TIP) ×1 IMPLANT
ELECTRODE REM PT RTRN 9FT ADLT (ELECTROSURGICAL) ×1 IMPLANT
GLOVE BIO SURGEON STRL SZ 6.5 (GLOVE) ×2 IMPLANT
GLOVE INDICATOR 7.0 STRL GRN (GLOVE) ×4 IMPLANT
GOWN STRL REUS W/ TWL LRG LVL3 (GOWN DISPOSABLE) ×2 IMPLANT
GOWN STRL REUS W/TWL LRG LVL3 (GOWN DISPOSABLE) ×2
GRASPER SUT TROCAR 14GX15 (MISCELLANEOUS) ×2 IMPLANT
IRRIGATION STRYKERFLOW (MISCELLANEOUS) ×1 IMPLANT
IRRIGATOR STRYKERFLOW (MISCELLANEOUS) ×2
IV NS 1000ML (IV SOLUTION) ×1
IV NS 1000ML BAXH (IV SOLUTION) ×1 IMPLANT
KIT TURNOVER KIT A (KITS) ×2 IMPLANT
KITTNER LAPARASCOPIC 5X40 (MISCELLANEOUS) IMPLANT
LABEL OR SOLS (LABEL) IMPLANT
LIGASURE LAP MARYLAND 5MM 37CM (ELECTROSURGICAL) IMPLANT
NEEDLE HYPO 22GX1.5 SAFETY (NEEDLE) ×2 IMPLANT
NS IRRIG 500ML POUR BTL (IV SOLUTION) ×2 IMPLANT
PACK LAP CHOLECYSTECTOMY (MISCELLANEOUS) ×2 IMPLANT
PENCIL ELECTRO HAND CTR (MISCELLANEOUS) ×2 IMPLANT
POUCH SPECIMEN RETRIEVAL 10MM (ENDOMECHANICALS) ×2 IMPLANT
RELOAD STAPLE TA45 3.5 REG BLU (ENDOMECHANICALS) ×2 IMPLANT
SCISSORS METZENBAUM CVD 33 (INSTRUMENTS) IMPLANT
SET TUBE SMOKE EVAC HIGH FLOW (TUBING) ×2 IMPLANT
SHEARS HARMONIC ACE PLUS 36CM (ENDOMECHANICALS) ×2 IMPLANT
SLEEVE ADV FIXATION 5X100MM (TROCAR) ×4 IMPLANT
STRIP CLOSURE SKIN 1/2X4 (GAUZE/BANDAGES/DRESSINGS) ×2 IMPLANT
SUT MNCRL 4-0 (SUTURE) ×1
SUT MNCRL 4-0 27XMFL (SUTURE) ×1
SUT VIC AB 3-0 SH 27 (SUTURE) ×2
SUT VIC AB 3-0 SH 27X BRD (SUTURE) ×2 IMPLANT
SUT VICRYL 0 AB UR-6 (SUTURE) ×2 IMPLANT
SUTURE MNCRL 4-0 27XMF (SUTURE) ×1 IMPLANT
SYS KII FIOS ACCESS ABD 5X100 (TROCAR) ×2
SYSTEM KII FIOS ACES ABD 5X100 (TROCAR) ×1 IMPLANT
TRAY FOLEY MTR SLVR 16FR STAT (SET/KITS/TRAYS/PACK) ×2 IMPLANT
TROCAR 130MM GELPORT  DAV (MISCELLANEOUS) ×2 IMPLANT
TROCAR ADV FIXATION 12X100MM (TROCAR) IMPLANT

## 2019-10-01 NOTE — Op Note (Signed)
Operative Note  Laparoscopic Appendectomy   [name] Date of operation:  10/01/2019  Indications: The patient presented with a history of  abdominal pain. Workup has revealed findings consistent with acute appendicitis.  Pre-operative Diagnosis: Acute appendicitis without mention of peritonitis  Post-operative Diagnosis: Acute appendicitis without mention of peritonitis  Surgeon: Fredirick Maudlin, MD  Anesthesia: GETA  Findings: Non-perforated appendix, no pus or purulent fluid seen.  Localized peritonitis.  Estimated Blood Loss:  5 cc          Specimens: appendix         Complications:  None immediately apparent.  Procedure Details  The patient was seen again in the preop area. The options of surgery versus observation were reviewed with the patient and/or family. The risks of bleeding, infection, recurrence of symptoms, negative laparoscopy, potential for an open procedure, bowel injury, abscess or infection, were all reviewed as well. The patient was taken to Operating Room, identified as Kevin Gregory and the procedure verified as laparoscopic appendectomy. A time out was performed and the above information confirmed.  The patient was placed in the supine position and general anesthesia was induced.  Antibiotic prophylaxis was not administered as the patient had been receiving antibiotics on the floow and VTE prophylaxis was in place. A Foley catheter was placed by the nursing staff.   The abdomen was prepped and draped in a sterile fashion. A supraumbilical incision was made. A cutdown technique was used to enter the abdominal cavity. Two vicryl stitches were placed on the fascia and a Hasson trocar inserted. Pneumoperitoneum obtained. Two 5 mm ports were placed under direct visualization.  The appendix was identified and found to be acutely inflamed, with localized peritonitis. No diffuse peritonitis seen. No perforation or pus seen.  The appendix was carefully  dissected. The mesoappendix was divided with the Harmonic scalpel.  The base of the appendix was dissected out and divided with a standard load Endo GIA. The appendix was placed in a Endo Catch bag and removed via the Hasson port. The right lower quadrant and pelvis was then irrigated with normal saline which was then aspirated. The right lower quadrant was inspected there was no sign of bleeding or bowel injury. There was some mild raw surface oozing that was managed with Vistaseal. Pneumoperitoneum was released, all ports were removed.  The umbilical fascia was closed with 0 Vicryl interrupted sutures and the skin incisions were approximated with subcuticular 4-0 Monocryl. Dermabond was applied The patient tolerated the procedure well and there were no immediately apparent complications. The sponge lap and needle count were correct at the end of the procedure.  The patient was taken to the recovery room in stable condition to be admitted for continued care.   Fredirick Maudlin, MD, FACS

## 2019-10-01 NOTE — Transfer of Care (Signed)
Immediate Anesthesia Transfer of Care Note  Patient: Kevin Gregory  Procedure(s) Performed: APPENDECTOMY LAPAROSCOPIC (N/A )  Patient Location: PACU  Anesthesia Type:General  Level of Consciousness: sedated  Airway & Oxygen Therapy: Patient Spontanous Breathing and Patient connected to face mask oxygen  Post-op Assessment: Report given to RN and Post -op Vital signs reviewed and stable  Post vital signs: Reviewed and stable  Last Vitals:  Vitals Value Taken Time  BP 107/66 10/01/19 1555  Temp    Pulse 94 10/01/19 1558  Resp 13 10/01/19 1558  SpO2 100 % 10/01/19 1558  Vitals shown include unvalidated device data.  Last Pain:  Vitals:   10/01/19 1555  TempSrc:   PainSc: (P) Asleep         Complications: No apparent anesthesia complications

## 2019-10-01 NOTE — Progress Notes (Deleted)
Kevin Gregory  A and O x 4. VSS. Pt tolerating diet well. No complaints of pain or nausea. IV removed intact, prescriptions given. Pt voiced understanding of discharge instructions with no further questions. Pt discharged home.      Vitals:   10/01/19 0507 10/01/19 1143  BP: 125/76 130/77  Pulse: 69 90  Resp: 18 16  Temp: 98.5 F (36.9 C) 98.5 F (36.9 C)  SpO2: 99% 98%    Francesco Sor

## 2019-10-01 NOTE — Anesthesia Procedure Notes (Signed)
Procedure Name: Intubation Date/Time: 10/01/2019 2:09 PM Performed by: Aline Brochure, CRNA Pre-anesthesia Checklist: Patient identified, Emergency Drugs available, Suction available and Patient being monitored Patient Re-evaluated:Patient Re-evaluated prior to induction Oxygen Delivery Method: Circle system utilized Preoxygenation: Pre-oxygenation with 100% oxygen Induction Type: IV induction Ventilation: Two handed mask ventilation required Laryngoscope Size: McGraph and 4 Grade View: Grade I Tube type: Oral Tube size: 7.5 mm Number of attempts: 1 Airway Equipment and Method: Stylet and Video-laryngoscopy Placement Confirmation: ETT inserted through vocal cords under direct vision,  positive ETCO2 and breath sounds checked- equal and bilateral Secured at: 22 cm Tube secured with: Tape Dental Injury: Teeth and Oropharynx as per pre-operative assessment

## 2019-10-01 NOTE — Anesthesia Postprocedure Evaluation (Signed)
Anesthesia Post Note  Patient: Kevin Gregory  Procedure(s) Performed: APPENDECTOMY LAPAROSCOPIC (N/A )  Patient location during evaluation: PACU Anesthesia Type: General Level of consciousness: awake and alert Pain management: pain level controlled Vital Signs Assessment: post-procedure vital signs reviewed and stable Respiratory status: spontaneous breathing and respiratory function stable Cardiovascular status: stable Anesthetic complications: no     Last Vitals:  Vitals:   10/01/19 1721 10/01/19 1748  BP: 116/74 111/66  Pulse: 78 72  Resp: 18 17  Temp: (!) 36.4 C 36.7 C  SpO2: 96% 96%    Last Pain:  Vitals:   10/01/19 1721  TempSrc: Oral  PainSc:                  KEPHART,WILLIAM K

## 2019-10-01 NOTE — Anesthesia Preprocedure Evaluation (Signed)
Anesthesia Evaluation  Patient identified by MRN, date of birth, ID band Patient awake    Reviewed: Allergy & Precautions, NPO status , Patient's Chart, lab work & pertinent test results  History of Anesthesia Complications Negative for: history of anesthetic complications  Airway Mallampati: III  TM Distance: >3 FB Neck ROM: Full    Dental no notable dental hx.    Pulmonary neg sleep apnea, neg COPD, former smoker,    breath sounds clear to auscultation- rhonchi (-) wheezing      Cardiovascular Exercise Tolerance: Good (-) hypertension(-) CAD, (-) Past MI, (-) Cardiac Stents and (-) CABG  Rhythm:Regular Rate:Normal - Systolic murmurs and - Diastolic murmurs    Neuro/Psych neg Seizures negative neurological ROS  negative psych ROS   GI/Hepatic Neg liver ROS, GERD  ,  Endo/Other  negative endocrine ROSneg diabetes  Renal/GU Renal disease: hx of nephrolithiasis.     Musculoskeletal negative musculoskeletal ROS (+)   Abdominal (+) + obese,   Peds  Hematology negative hematology ROS (+)   Anesthesia Other Findings Past Medical History: 12/01/2011: Erectile dysfunction No date: GERD (gastroesophageal reflux disease) No date: Kidney stones No date: Nephrolithiasis   Reproductive/Obstetrics                             Anesthesia Physical Anesthesia Plan  ASA: II  Anesthesia Plan: General   Post-op Pain Management:    Induction: Intravenous  PONV Risk Score and Plan: 1 and Ondansetron, Dexamethasone and Midazolam  Airway Management Planned: Oral ETT  Additional Equipment:   Intra-op Plan:   Post-operative Plan: Extubation in OR  Informed Consent: I have reviewed the patients History and Physical, chart, labs and discussed the procedure including the risks, benefits and alternatives for the proposed anesthesia with the patient or authorized representative who has indicated his/her  understanding and acceptance.     Dental advisory given  Plan Discussed with: CRNA and Anesthesiologist  Anesthesia Plan Comments:         Anesthesia Quick Evaluation

## 2019-10-01 NOTE — H&P (Addendum)
[facility] SURGICAL HISTORY & PHYSICAL (cpt [phone])  HISTORY OF PRESENT ILLNESS (HPI):  44 y.o. male presented to [facility] ED yesterday for abdominal pain. Patient reported the first onset of symptoms was around 48 hours ago. At first he noticed mainly non-bloody diarrhea which he attributed to something he ate. However ov er the course of the day he develop nausea, emesis, and RLQ abdominal pain. The pain was described as an ache and moderate intensity. He denied any radiation of the pain. No history of similar pain in the past. Nothing made the pain better or worse. No associated fever, chills, cough, CP, SOB, or urinary changes. No previous abdominal surgeries. Work up in the ED was concerning for leukocytosis to 12K and CT Scan was concerning for acute uncomplicated appendicitis.   General surgery is consulted by emergency medicine physician Dr Derrell Lolling, MD for evaluation and management of acute appendicitis.    PAST MEDICAL HISTORY (PMH):  Past Medical History:  Diagnosis Date   Erectile dysfunction 12/01/2011   GERD (gastroesophageal reflux disease)    Kidney stones    Nephrolithiasis     Reviewed. Otherwise negative.   PAST SURGICAL HISTORY ([place]):  History reviewed. No pertinent surgical history.  Reviewed. Otherwise negative.   MEDICATIONS:  Prior to Admission medications   Medication Sig Start Date End Date Taking? Authorizing Provider  Creatine POWD 1 Scoop by Does not apply route See admin instructions. Take 1 scoop dissolved in water three times a week pre-workout.   Yes [provider]     ALLERGIES:  No Known Allergies   SOCIAL HISTORY:  Social History   Socioeconomic History   Marital status: Widowed    Spouse name: Not on file   Number of children: Not on file   Years of education: 12   Highest education level: Not on file  Occupational History   Occupation: Designer, industrial/product  Tobacco Use   Smoking status: Former Smoker    Smokeless tobacco: Never Used  Substance and Sexual Activity   Alcohol use: Yes    Comment: rare   Drug use: No   Sexual activity: Not on file  Other Topics Concern   Not on file  Social History Narrative   Not on file   Social Determinants of Health   Financial Resource Strain:    Difficulty of Paying Living Expenses: Not on file  Food Insecurity:    Worried About Charity fundraiser in the Last Year: Not on file   [place] in the Last Year: Not on file  Transportation Needs:    Lack of Transportation (Medical): Not on file   Lack of Transportation (Non-Medical): Not on file  Physical Activity:    Days of Exercise per Week: Not on file   Minutes of Exercise per Session: Not on file  Stress:    Feeling of Stress : Not on file  Social Connections:    Frequency of Communication with Friends and Family: Not on file   Frequency of Social Gatherings with Friends and Family: Not on file   Attends Religious Services: Not on file   Active Member of Clubs or Organizations: Not on file   Attends Archivist Meetings: Not on file   Marital Status: Not on file  Intimate Partner Violence:    Fear of Current or Ex-Partner: Not on file   Emotionally Abused: Not on file   Physically Abused: Not on file   Sexually Abused: Not on file  FAMILY HISTORY:  Family History  Problem Relation Age of Onset   Drug abuse Maternal Grandmother     Otherwise negative.   REVIEW OF SYSTEMS:  Review of Systems  Constitutional: Negative for chills and fever.  HENT: Negative for congestion and sore throat.   Respiratory: Negative for cough and shortness of breath.   Cardiovascular: Negative for chest pain and palpitations.  Gastrointestinal: Positive for abdominal pain, diarrhea, nausea and vomiting. Negative for blood in stool and constipation.  Genitourinary: Negative for dysuria and urgency.  All other systems reviewed and are negative.   VITAL SIGNS:  Temp:  [97.5 F  (36.4 C)-98.5 F (36.9 C)] 98.5 F (36.9 C) (02/22 0507) Pulse Rate:  [47-105] 69 (02/22 0507) Resp:  [17-25] 18 (02/22 0507) BP: (106-153)/(69-91) 125/76 (02/22 0507) SpO2:  [97 %-100 %] 99 % (02/22 0507) Weight:  [102.1 kg-102.5 kg] 102.5 kg (02/22 0002)     Height: 5\' 10"  (177.8 cm) Weight: 102.5 kg BMI (Calculated): 32.42   PHYSICAL EXAM:  Physical Exam Vitals and nursing note reviewed.  Constitutional:      General: He is not in acute distress.    Appearance: He is well-developed. He is obese. He is not ill-appearing.  HENT:     Head: Normocephalic and atraumatic.  Eyes:     General: No scleral icterus.    Extraocular Movements: Extraocular movements intact.  Cardiovascular:     Rate and Rhythm: Normal rate and regular rhythm.     Heart sounds: Normal heart sounds. No murmur.  Pulmonary:     Effort: Pulmonary effort is normal. No respiratory distress.     Breath sounds: Normal breath sounds.  Abdominal:     General: Abdomen is flat. There is no distension.     Palpations: Abdomen is soft.     Tenderness: There is abdominal tenderness in the right lower quadrant. There is no guarding or rebound. Positive signs include McBurney's sign.  Genitourinary:    Comments: Deferred Skin:    General: Skin is warm and dry.     Coloration: Skin is not jaundiced or pale.  Neurological:     General: No focal deficit present.     Mental Status: He is alert and oriented to person, place, and time.  Psychiatric:        Mood and Affect: Mood normal.        Behavior: Behavior normal.     INTAKE/OUTPUT:  This shift: No intake/output data recorded.  Last 2 shifts: @IOLAST2SHIFTS @  Labs:  CBC Latest Ref Rng & Units 10/01/2019 09/30/2019 07/02/2015  WBC 4.0 - 10.5 K/uL 10.8(H) 12.5(H) 7.3  Hemoglobin 13.0 - 17.0 g/dL 14.4 16.2 15.4  Hematocrit 39.0 - 52.0 % 41.8 46.7 45.1  Platelets 150 - 400 K/uL 207 245 213.0   CMP Latest Ref Rng & Units 10/01/2019 09/30/2019 07/02/2015  Glucose 70  - 99 mg/dL 149(H) 131(H) 88  BUN 6 - 20 mg/dL 9 9 17   Creatinine 0.61 - 1.24 mg/dL 1.10 1.11 1.06  Sodium 135 - 145 mmol/L 139 139 139  Potassium 3.5 - 5.1 mmol/L 3.4(L) 4.1 3.8  Chloride 98 - 111 mmol/L 105 102 104  CO2 22 - 32 mmol/L 27 26 26   Calcium 8.9 - 10.3 mg/dL 8.5(L) 9.4 9.1  Total Protein 6.5 - 8.1 g/dL - 8.1 7.3  Total Bilirubin 0.3 - 1.2 mg/dL - 1.2 0.8  Alkaline Phos 38 - 126 U/L - 93 91  AST 15 - 41 U/L - 22 23  ALT 0 - 44 U/L - 23 23     Imaging studies:   CT Abdomen/Pelvis (09/30/2019) personally reviewed showing acute appendicitis without evidence of perforation or abscess, and radiologist report reviewed:  IMPRESSION: 1. Acute appendicitis.  No abscess or perforation. 2. Fatty liver.   Assessment/Plan: (ICD-10's: K35.80) 44 y.o. male with acute uncomplicated appendicitis   - Admit to general surgery  - NPO  - IVF Resuscitation; D5+LR   - IV Abx (Zosyn)  - Pain control prn: antiemetics prn  - monitor abdominal examination; trend leukocytosis  - Will plan for laparoscopic appendectomy today pending OR/Anesthesia availability with Dr Celine Ahr  - All risks, benefits, and alternatives to above procedure(s) were discussed with the patient, all of his questions were answered to his expressed satisfaction, patient expresses he wishes to proceed, and informed consent was obtained.  - DVT prophylaxis; hold for OR  All of the above findings and recommendations were discussed with the patient, and all of his questions were answered to his expressed satisfaction.  -- Kevin Simon, PA-C [facility] 10/01/2019, 7:33 AM [phone] M-F: 7am - 4pm  I saw and evaluated the patient.  I agree with the above documentation, exam, and plan, which I have edited where appropriate. Fredirick Maudlin  8:50 AM

## 2019-10-02 LAB — HIV ANTIBODY (ROUTINE TESTING W REFLEX): HIV Screen 4th Generation wRfx: NONREACTIVE — AB

## 2019-10-02 MED ORDER — OXYCODONE HCL 5 MG PO TABS: 5.0000 mg | ORAL_TABLET | Freq: Four times a day (QID) | ORAL | 0 refills | Status: DC | PRN

## 2019-10-02 MED ORDER — AMOXICILLIN-POT CLAVULANATE 875-125 MG PO TABS: 1.0000 | ORAL_TABLET | Freq: Two times a day (BID) | ORAL | 0 refills | Status: AC

## 2019-10-02 NOTE — Discharge Summary (Signed)
[facility] SURGICAL DISCHARGE SUMMARY  Patient ID: Kevin Gregory MRN: [id number] DOB/AGE: 04-05-1976 44 y.o.  Admit date: 09/30/2019 Discharge date: 10/02/2019  Discharge Diagnoses Patient Active Problem List   Diagnosis Date Noted  . Acute appendicitis 09/30/2019    Consultants None  Procedures 10/01/2019:  Laparoscopic Appendectomy   HPI: 44 y.o. male presented to [facility] ED yesterday for abdominal pain. Patient reported the first onset of symptoms was around 48 hours ago. At first he noticed mainly non-bloody diarrhea which he attributed to something he ate. However ov er the course of the day he develop nausea, emesis, and RLQ abdominal pain. The pain was described as an ache and moderate intensity. He denied any radiation of the pain. No history of similar pain in the past. Nothing made the pain better or worse. No associated fever, chills, cough, CP, SOB, or urinary changes. No previous abdominal surgeries. Work up in the ED was concerning for leukocytosis to 12K and CT Scan was concerning for acute uncomplicated appendicitis.   Hospital Course: Informed consent was obtained and documented, and patient underwent uneventful laparoscopic appendectomy (Dr Celine Ahr, 10/01/2019).  Post-operatively, patient's symptoms and pain improved/resolved and advancement of patient's diet and ambulation were well-tolerated. The remainder of patient's hospital course was essentially unremarkable, and discharge planning was initiated accordingly with patient safely able to be discharged home with appropriate discharge instructions, antibiotics (Augmentin x5 days), pain control, and outpatient follow-up after all of his questions were answered to his expressed satisfaction.   Discharge Condition: Good   Physical Examination:  Constitutional: Well appearing male, NAD Pulmonary: Normal effort, no respiratory distress Gastrointestinal: Soft, incisional soreness, non-distended, no  rebound.guarding Skin: Laparoscopic incisions are CDI with steri-strips, no erythema or drainage    Allergies as of 10/02/2019   No Known Allergies     Medication List    TAKE these medications   amoxicillin-clavulanate 875-125 MG tablet Commonly known as: Augmentin Take 1 tablet by mouth 2 (two) times daily for 5 days.   Creatine Powd 1 Scoop by Does not apply route See admin instructions. Take 1 scoop dissolved in water three times a week pre-workout.   oxyCODONE 5 MG immediate release tablet Commonly known as: Oxy IR/ROXICODONE Take 1 tablet (5 mg total) by mouth every 6 (six) hours as needed for severe pain or breakthrough pain.        Follow-up Information    Tylene Fantasia, PA-C. Schedule an appointment as soon as possible for a visit in 2 week(s).   Specialty: Physician Assistant Why: s/p lap appy Contact information: [address] [place] [address] [phone]            Time spent on discharge management including discussion of hospital course, clinical condition, outpatient instructions, prescriptions, and follow up with the patient and members of the medical team: >30 minutes  -- Kevin Gregory , PA-C [facility]  10/02/2019, 8:15 AM [phone] M-F: 7am - 4pm

## 2019-10-02 NOTE — Progress Notes (Signed)
Kevin Gregory  A and O x 4. VSS. Pt tolerating diet well. No complaints of pain or nausea. IV removed intact, prescriptions given. Pt voiced understanding of discharge instructions with no further questions. Pt discharged home.   Allergies as of 10/02/2019   No Known Allergies     Medication List    TAKE these medications   amoxicillin-clavulanate 875-125 MG tablet Commonly known as: Augmentin Take 1 tablet by mouth 2 (two) times daily for 5 days.   Creatine Powd 1 Scoop by Does not apply route See admin instructions. Take 1 scoop dissolved in water three times a week pre-workout.   oxyCODONE 5 MG immediate release tablet Commonly known as: Oxy IR/ROXICODONE Take 1 tablet (5 mg total) by mouth every 6 (six) hours as needed for severe pain or breakthrough pain.       Vitals:   10/01/19 1748 10/02/19 0416  BP: 111/66 105/67  Pulse: 72 72  Resp: 17 18  Temp: 98 F (36.7 C) 97.7 F (36.5 C)  SpO2: 96% 97%    Francesco Sor

## 2019-10-02 NOTE — Discharge Instructions (Signed)
In addition to included general post-operative instructions for laparoscopic appendectomy,  Diet: Resume home diet.   Activity: No heavy lifting >20 pounds (children, pets, laundry, garbage) for 4 weeks, but light activity and walking are encouraged. Do not drive or drink alcohol if taking narcotic pain medications or having pain that might distract from driving.  Wound care: 2 days after surgery (02/24), you may shower/get incision wet with soapy water and pat dry (do not rub incisions), but no baths or submerging incision underwater until follow-up. Do not remove steri-strips, they will start to fall off when they are ready.   Medications: Resume all home. For mild to moderate pain: acetaminophen (Tylenol) or ibuprofen/naproxen (if no kidney disease). Combining Tylenol with alcohol can substantially increase your risk of causing liver disease. Narcotic pain medications, if prescribed, can be used for severe pain, though may cause nausea, constipation, and drowsiness. Do not combine Tylenol and Percocet (or similar) within a 6 hour period as Percocet (and similar) contain(s) Tylenol. If you do not need the narcotic pain medication, you do not need to fill the prescription.  Call office [phone] / [phone]) at any time if any questions, worsening pain, fevers/chills, bleeding, drainage from incision site, or other concerns.

## 2019-10-03 LAB — SURGICAL PATHOLOGY

## 2019-10-09 DIAGNOSIS — L271 Localized skin eruption due to drugs and medicaments taken internally: Secondary | ICD-10-CM | POA: Diagnosis not present

## 2019-10-17 ENCOUNTER — Ambulatory Visit (INDEPENDENT_AMBULATORY_CARE_PROVIDER_SITE_OTHER): Payer: Self-pay | Admitting: Physician Assistant

## 2019-10-17 ENCOUNTER — Encounter: Payer: Self-pay | Admitting: Physician Assistant

## 2019-10-17 ENCOUNTER — Other Ambulatory Visit: Payer: Self-pay

## 2019-10-17 VITALS — BP 100/65 | HR 82 | Temp 97.0°F | Resp 13 | Ht 70.0 in | Wt 222.2 lb

## 2019-10-17 DIAGNOSIS — Z09 Encounter for follow-up examination after completed treatment for conditions other than malignant neoplasm: Secondary | ICD-10-CM

## 2019-10-17 DIAGNOSIS — K3580 Unspecified acute appendicitis: Secondary | ICD-10-CM

## 2019-10-17 NOTE — Progress Notes (Signed)
[facility] POST-OP OFFICE VISIT  10/17/2019  HPI: Kevin Gregory is a 44 y.o. male 16 days s/p laparoscopic appendectomy with Dr Celine Ahr  Overall doing well, pain significantly improved. He did note that he got a rash from the Oxycodone. He had completed his ABx a few days before the rash started. No fever, chills, nausea, or emesis. Tolerating PO. Having normal bowel function.   Vital signs: BP 100/65   Pulse 82   Temp (!) 97 F (36.1 C)   Resp 13   Ht 5\' 10"  (1.778 m)   Wt 222 lb 3.2 oz (100.8 kg)   SpO2 96%   BMI 31.88 kg/m    Physical Exam: Constitutional: Well appearing male, NAD Abdomen: soft, non-tender, non-distended, no rebound/guarding Skin: Laparoscopic incisions are well healed, no erythema or drainage  Assessment/Plan: This is a 44 y.o. male 16 days s/p laparoscopic appendectomy with Dr Celine Ahr   - pain control prn  - okay to shower; reviewed wound care  - reviewed lifting restrictions   - reviewed pathology: Severe appendicitis  - rtc prn  -- Edison Simon, PA-C  [facility] 10/17/2019, 10:19 AM [phone] M-F: 7am - 4pm

## 2019-10-17 NOTE — Patient Instructions (Addendum)
Otho Ket, PA-C advised patient two more weeks of no heavy lifting of 15 pounds or more.   GENERAL POST-OPERATIVE PATIENT INSTRUCTIONS   WOUND CARE INSTRUCTIONS: Try to keep the wound dry and avoid ointments on the wound unless directed to do so.  If the wound becomes bright red and painful or starts to drain infected material that is not clear, please contact your physician immediately.  If the wound is mildly pink and has a thick firm ridge underneath it, this is normal, and is referred to as a healing ridge.  This will resolve over the next 4-6 weeks.  BATHING: You may shower if you have been informed of this by your surgeon. However, Please do not submerge in a tub, hot tub, or pool until incisions are completely sealed or have been told by your surgeon that you may do so.  ACTIVITY:  You are encouraged to cough and deep breath or use your incentive spirometer if you were given one, every 15-30 minutes when awake.  This will help prevent respiratory complications and low grade fevers post-operatively if you had a general anesthetic.  You may want to hug a pillow when coughing and sneezing to add additional support to the surgical area, if you had abdominal or chest surgery, which will decrease pain during these times.  You are encouraged to walk and engage in light activity for the next two weeks.  You should not lift more than 20 pounds for 6 weeks after surgery as it could put you at increased risk for complications.  Twenty pounds is roughly equivalent to a plastic bag of groceries. At that time- Listen to your body when lifting, if you have pain when lifting, stop and then try again in a few days. Soreness after doing exercises or activities of daily living is normal as you get back in to your normal routine.  MEDICATIONS:  Try to take narcotic medications and anti-inflammatory medications, such as tylenol, ibuprofen, naprosyn, etc., with food.  This will minimize stomach upset from the  medication.  Should you develop nausea and vomiting from the pain medication, or develop a rash, please discontinue the medication and contact your physician.  You should not drive, make important decisions, or operate machinery when taking narcotic pain medication.  SUNBLOCK Use sun block to incision area over the next year if this area will be exposed to sun. This helps decrease scarring and will allow you avoid a permanent darkened area over your incision.  QUESTIONS:  Please feel free to call our office if you have any questions, and we will be glad to assist you.

## 2020-03-14 DIAGNOSIS — Z20822 Contact with and (suspected) exposure to covid-19: Secondary | ICD-10-CM | POA: Diagnosis not present

## 2020-03-14 DIAGNOSIS — U071 COVID-19: Secondary | ICD-10-CM | POA: Diagnosis not present

## 2020-03-19 DIAGNOSIS — U071 COVID-19: Secondary | ICD-10-CM | POA: Diagnosis not present

## 2020-03-19 DIAGNOSIS — R0602 Shortness of breath: Secondary | ICD-10-CM | POA: Diagnosis not present

## 2020-03-20 ENCOUNTER — Emergency Department: Payer: Federal, State, Local not specified - PPO

## 2020-03-20 ENCOUNTER — Encounter: Payer: Self-pay | Admitting: Emergency Medicine

## 2020-03-20 ENCOUNTER — Inpatient Hospital Stay
Admission: EM | Admit: 2020-03-20 | Discharge: 2020-03-24 | DRG: 177 | Disposition: A | Discharge status: Home / Self Care | Payer: Federal, State, Local not specified - PPO | Attending: Internal Medicine | Admitting: Internal Medicine

## 2020-03-20 ENCOUNTER — Other Ambulatory Visit: Payer: Self-pay

## 2020-03-20 DIAGNOSIS — E876 Hypokalemia: Secondary | ICD-10-CM | POA: Diagnosis present

## 2020-03-20 DIAGNOSIS — J9601 Acute respiratory failure with hypoxia: Secondary | ICD-10-CM | POA: Diagnosis not present

## 2020-03-20 DIAGNOSIS — R739 Hyperglycemia, unspecified: Secondary | ICD-10-CM | POA: Diagnosis not present

## 2020-03-20 DIAGNOSIS — Z87891 Personal history of nicotine dependence: Secondary | ICD-10-CM

## 2020-03-20 DIAGNOSIS — U071 COVID-19: Secondary | ICD-10-CM | POA: Diagnosis not present

## 2020-03-20 DIAGNOSIS — K219 Gastro-esophageal reflux disease without esophagitis: Secondary | ICD-10-CM | POA: Diagnosis present

## 2020-03-20 DIAGNOSIS — T380X5A Adverse effect of glucocorticoids and synthetic analogues, initial encounter: Secondary | ICD-10-CM | POA: Diagnosis not present

## 2020-03-20 DIAGNOSIS — J1282 Pneumonia due to coronavirus disease 2019: Secondary | ICD-10-CM | POA: Diagnosis not present

## 2020-03-20 LAB — CBC
HCT: 41.9 % (ref 39.0–52.0)
Hemoglobin: 14.5 g/dL (ref 13.0–17.0)
MCH: 29.2 pg (ref 26.0–34.0)
MCHC: 34.6 g/dL (ref 30.0–36.0)
MCV: 84.3 fL (ref 80.0–100.0)
Platelets: 144 10*3/uL — ABNORMAL LOW (ref 150–400)
RBC: 4.97 MIL/uL (ref 4.22–5.81)
RDW: 11.8 % (ref 11.5–15.5)
WBC: 4.6 10*3/uL (ref 4.0–10.5)
nRBC: 0 % (ref 0.0–0.2)

## 2020-03-20 LAB — COMPREHENSIVE METABOLIC PANEL
ALT: 23 U/L (ref 0–44)
AST: 36 U/L (ref 15–41)
Albumin: 3.9 g/dL (ref 3.5–5.0)
Alkaline Phosphatase: 54 U/L (ref 38–126)
Anion gap: 13 (ref 5–15)
BUN: 15 mg/dL (ref 6–20)
CO2: 25 mmol/L (ref 22–32)
Calcium: 8.3 mg/dL — ABNORMAL LOW (ref 8.9–10.3)
Chloride: 97 mmol/L — ABNORMAL LOW (ref 98–111)
Creatinine, Ser: 1.12 mg/dL (ref 0.61–1.24)
GFR calc Af Amer: >60 mL/min (ref >60)
GFR calc non Af Amer: >60 mL/min (ref >60)
Glucose, Bld: 116 mg/dL — ABNORMAL HIGH (ref 70–99)
Potassium: 3.3 mmol/L — ABNORMAL LOW (ref 3.5–5.1)
Sodium: 135 mmol/L (ref 135–145)
Total Bilirubin: 1.3 mg/dL — ABNORMAL HIGH (ref 0.3–1.2)
Total Protein: 7.9 g/dL (ref 6.5–8.1)

## 2020-03-20 MED ORDER — ACETAMINOPHEN 500 MG PO TABS
1000.0000 mg | ORAL_TABLET | Freq: Once | ORAL | Status: AC
  Administered 2020-03-20: 1000 mg via ORAL
  Filled 2020-03-20: qty 2

## 2020-03-20 NOTE — ED Triage Notes (Signed)
Pt here with c/o worsening cough, fatigue and shob over the past few days, shob in triage, HR 135. Symptoms since 03/10/20; temp 102.7 in triage.

## 2020-03-21 ENCOUNTER — Emergency Department: Payer: Federal, State, Local not specified - PPO

## 2020-03-21 ENCOUNTER — Other Ambulatory Visit: Payer: Self-pay

## 2020-03-21 DIAGNOSIS — J1282 Pneumonia due to coronavirus disease 2019: Secondary | ICD-10-CM | POA: Diagnosis not present

## 2020-03-21 DIAGNOSIS — T380X5A Adverse effect of glucocorticoids and synthetic analogues, initial encounter: Secondary | ICD-10-CM | POA: Diagnosis present

## 2020-03-21 DIAGNOSIS — K219 Gastro-esophageal reflux disease without esophagitis: Secondary | ICD-10-CM

## 2020-03-21 DIAGNOSIS — E876 Hypokalemia: Secondary | ICD-10-CM | POA: Diagnosis present

## 2020-03-21 DIAGNOSIS — J9601 Acute respiratory failure with hypoxia: Secondary | ICD-10-CM

## 2020-03-21 DIAGNOSIS — U071 COVID-19: Secondary | ICD-10-CM | POA: Diagnosis present

## 2020-03-21 DIAGNOSIS — Z87891 Personal history of nicotine dependence: Secondary | ICD-10-CM | POA: Diagnosis not present

## 2020-03-21 DIAGNOSIS — R739 Hyperglycemia, unspecified: Secondary | ICD-10-CM | POA: Diagnosis present

## 2020-03-21 LAB — HEPATITIS B SURFACE ANTIGEN: Hepatitis B Surface Ag: NONREACTIVE

## 2020-03-21 LAB — PROCALCITONIN: Procalcitonin: <0.1 ng/mL

## 2020-03-21 LAB — LACTATE DEHYDROGENASE: LDH: 209 U/L — ABNORMAL HIGH (ref 98–192)

## 2020-03-21 LAB — C-REACTIVE PROTEIN: CRP: 0.6 mg/dL (ref <1.0)

## 2020-03-21 LAB — FERRITIN: Ferritin: 860 ng/mL — ABNORMAL HIGH (ref 24–336)

## 2020-03-21 LAB — TRIGLYCERIDES: Triglycerides: 68 mg/dL (ref <150)

## 2020-03-21 LAB — BRAIN NATRIURETIC PEPTIDE: B Natriuretic Peptide: 16.4 pg/mL (ref 0.0–100.0)

## 2020-03-21 LAB — SARS CORONAVIRUS 2 BY RT PCR (HOSPITAL ORDER, PERFORMED IN ~~LOC~~ HOSPITAL LAB): SARS Coronavirus 2: POSITIVE — AB

## 2020-03-21 LAB — FIBRIN DERIVATIVES D-DIMER (ARMC ONLY): Fibrin derivatives D-dimer (ARMC): 1142.17 ng/mL (FEU) — ABNORMAL HIGH (ref 0.00–499.00)

## 2020-03-21 LAB — TROPONIN I (HIGH SENSITIVITY): Troponin I (High Sensitivity): 3 ng/L (ref <18)

## 2020-03-21 LAB — FIBRINOGEN: Fibrinogen: 592 mg/dL — ABNORMAL HIGH (ref 210–475)

## 2020-03-21 MED ORDER — IPRATROPIUM BROMIDE HFA 17 MCG/ACT IN AERS
2.0000 | INHALATION_SPRAY | RESPIRATORY_TRACT | Status: DC
  Administered 2020-03-21 – 2020-03-24 (×18): 2 via RESPIRATORY_TRACT
  Filled 2020-03-21 (×2): qty 12.9

## 2020-03-21 MED ORDER — SODIUM CHLORIDE 0.9 % IV SOLN
200.0000 mg | Freq: Once | INTRAVENOUS | Status: AC
  Administered 2020-03-21: 200 mg via INTRAVENOUS
  Filled 2020-03-21: qty 200

## 2020-03-21 MED ORDER — DM-GUAIFENESIN ER 30-600 MG PO TB12: 1.0000 | ORAL_TABLET | Freq: Two times a day (BID) | ORAL | Status: DC | PRN

## 2020-03-21 MED ORDER — IOHEXOL 350 MG/ML SOLN
100.0000 mL | Freq: Once | INTRAVENOUS | Status: AC | PRN
  Administered 2020-03-21: 100 mL via INTRAVENOUS

## 2020-03-21 MED ORDER — ONDANSETRON HCL 4 MG/2ML IJ SOLN: 4.0000 mg | Freq: Three times a day (TID) | INTRAMUSCULAR | Status: DC | PRN

## 2020-03-21 MED ORDER — ALBUTEROL SULFATE HFA 108 (90 BASE) MCG/ACT IN AERS
2.0000 | INHALATION_SPRAY | RESPIRATORY_TRACT | Status: DC | PRN
  Administered 2020-03-21: 2 via RESPIRATORY_TRACT
  Filled 2020-03-21: qty 6.7

## 2020-03-21 MED ORDER — METHYLPREDNISOLONE SODIUM SUCC 125 MG IJ SOLR
60.0000 mg | Freq: Two times a day (BID) | INTRAMUSCULAR | Status: DC
  Administered 2020-03-21 – 2020-03-24 (×7): 60 mg via INTRAVENOUS
  Filled 2020-03-21 (×6): qty 2

## 2020-03-21 MED ORDER — ZINC SULFATE 220 (50 ZN) MG PO CAPS
220.0000 mg | ORAL_CAPSULE | Freq: Every day | ORAL | Status: DC
  Administered 2020-03-21 – 2020-03-24 (×4): 220 mg via ORAL
  Filled 2020-03-21 (×4): qty 1

## 2020-03-21 MED ORDER — ACETAMINOPHEN 325 MG PO TABS: 650.0000 mg | ORAL_TABLET | Freq: Four times a day (QID) | ORAL | Status: DC | PRN

## 2020-03-21 MED ORDER — ASCORBIC ACID 500 MG PO TABS
500.0000 mg | ORAL_TABLET | Freq: Every day | ORAL | Status: DC
  Administered 2020-03-21 – 2020-03-24 (×4): 500 mg via ORAL
  Filled 2020-03-21 (×4): qty 1

## 2020-03-21 MED ORDER — ENOXAPARIN SODIUM 40 MG/0.4ML ~~LOC~~ SOLN
40.0000 mg | SUBCUTANEOUS | Status: DC
  Administered 2020-03-21 – 2020-03-23 (×3): 40 mg via SUBCUTANEOUS
  Filled 2020-03-21 (×4): qty 0.4

## 2020-03-21 MED ORDER — SODIUM CHLORIDE 0.9 % IV SOLN
100.0000 mg | Freq: Every day | INTRAVENOUS | Status: DC
  Administered 2020-03-22 – 2020-03-24 (×3): 100 mg via INTRAVENOUS
  Filled 2020-03-21 (×3): qty 20
  Filled 2020-03-21: qty 100

## 2020-03-21 MED ORDER — LACTATED RINGERS IV BOLUS
1000.0000 mL | Freq: Once | INTRAVENOUS | Status: AC
  Administered 2020-03-21: 1000 mL via INTRAVENOUS

## 2020-03-21 MED ORDER — LOPERAMIDE HCL 2 MG PO CAPS: 2.0000 mg | ORAL_CAPSULE | ORAL | Status: DC | PRN

## 2020-03-21 MED ORDER — DEXAMETHASONE SODIUM PHOSPHATE 10 MG/ML IJ SOLN
6.0000 mg | Freq: Once | INTRAMUSCULAR | Status: AC
  Administered 2020-03-21: 6 mg via INTRAVENOUS
  Filled 2020-03-21: qty 1

## 2020-03-21 MED ORDER — PANTOPRAZOLE SODIUM 40 MG PO TBEC: 40.0000 mg | DELAYED_RELEASE_TABLET | Freq: Every day | ORAL | Status: DC | PRN

## 2020-03-21 MED ORDER — POTASSIUM CHLORIDE CRYS ER 20 MEQ PO TBCR
40.0000 meq | EXTENDED_RELEASE_TABLET | Freq: Once | ORAL | Status: AC
  Administered 2020-03-21: 40 meq via ORAL
  Filled 2020-03-21: qty 2

## 2020-03-21 NOTE — H&P (Signed)
History and Physical    Kevin Gregory SWF:[id number] DOB: 29-Apr-1976 DOA: 03/20/2020  Referring MD/NP/PA:   PCP: Biagio Borg, MD   Patient coming from:  The patient is coming from home.  At baseline, pt is independent for most of ADL.        Chief Complaint: Fever, chills, cough, shortness of breath  HPI: Kevin Gregory is a 44 y.o. male with medical history significant of GERD, kidney stone, erectile dysfunction, who presents with fever, chills, cough, shortness of breath.  Patient states that he has been sick for more than 10 days.  He has fever, chills, cough, sore throat, shortness of breath, decreased taste, some diarrhea.  He was tested positive for COVID-19 1 week ago. Patient has some dry cough, no chest pain. Patient has diarrhea, 1-2 times each day. Denies nausea, abdominal pain and vomiting currently. No symptoms of UTI or unilateral weakness.   Patient has oxygen desaturation to 87% on room air, which improved to 98% on 3 L oxygen.  ED Course: pt was found to have D-dimer 1142, positive COVID-19 PCR, troponin 3, WBC 4.6, potassium 3.3, renal function okay, temperature 102.7, blood pressure 116/74, tachycardia, tachypnea, chest x-ray showed bilateral multifocal infiltration.  CT angiogram was negative for PE, but showed bilateral patchy infiltration.  Patient is admitted to [facility] as inpatient.  Review of Systems:   General: has fevers, chills, no body weight gain, has poor appetite, has fatigue HEENT: no blurry vision, hearing changes or sore throat Respiratory: has dyspnea, coughing, no wheezing CV: no chest pain, no palpitations GI: no nausea, vomiting, abdominal pain, has diarrhea, no constipation GU: no dysuria, burning on urination, increased urinary frequency, hematuria  Ext: no leg edema Neuro: no unilateral weakness, numbness, or tingling, no vision change or hearing loss Skin: no rash, no skin tear. MSK: No muscle spasm, no deformity, no limitation of  range of movement in spin Heme: No easy bruising.  Travel history: No recent long distant travel.  Allergy:  Allergies  Allergen Reactions  . Oxycodone Rash    Past Medical History:  Diagnosis Date  . Erectile dysfunction 12/01/2011  . GERD (gastroesophageal reflux disease)   . Kidney stones   . Nephrolithiasis     Past Surgical History:  Procedure Laterality Date  . LAPAROSCOPIC APPENDECTOMY N/A 10/01/2019   Procedure: APPENDECTOMY LAPAROSCOPIC;  Surgeon: Fredirick Maudlin, MD;  Location: [facility] ORS;  Service: General;  Laterality: N/A;    Social History:  reports that he has quit smoking. He has never used smokeless tobacco. He reports current alcohol use. He reports that he does not use drugs.  Family History:  Family History  Problem Relation Age of Onset  . Drug abuse Maternal Grandmother      Prior to Admission medications   Medication Sig Start Date End Date Taking? Authorizing Provider  Creatine POWD 1 Scoop by Does not apply route See admin instructions. Take 1 scoop dissolved in water three times a week pre-workout. Patient not taking: Reported on 03/21/2020    [provider]    Physical Exam: Vitals:   03/21/20 0548 03/21/20 0630 03/21/20 0700 03/21/20 0800  BP: 126/76 116/74 125/79 120/73  Pulse: 87 86 82 79  Resp: (!) 26 (!) 27 (!) 31 (!) 24  Temp:    98.3 F (36.8 C)  TempSrc:    Oral  SpO2: 92% 94% 93% 95%  Weight:      Height:       General: Not  in acute distress HEENT:       Eyes: PERRL, EOMI, no scleral icterus.       ENT: No discharge from the ears and nose, no pharynx injection, no tonsillar enlargement.        Neck: No JVD, no bruit, no mass felt. Heme: No neck lymph node enlargement. Cardiac: S1/S2, RRR, No murmurs, No gallops or rubs. Respiratory: No rales, wheezing, rhonchi or rubs. GI: Soft, nondistended, nontender, no rebound pain, no organomegaly, BS present. GU: No hematuria Ext: No pitting leg edema bilaterally. 2+DP/PT  pulse bilaterally. Musculoskeletal: No joint deformities, No joint redness or warmth, no limitation of ROM in spin. Skin: No rashes.  Neuro: Alert, oriented X3, cranial nerves II-XII grossly intact, moves all extremities normally. Psych: Patient is not psychotic, no suicidal or hemocidal ideation.  Labs on Admission: I have personally reviewed following labs and imaging studies  CBC: Recent Labs  Lab 03/20/20 1823  WBC 4.6  HGB 14.5  HCT 41.9  MCV 84.3  PLT 094*   Basic Metabolic Panel: Recent Labs  Lab 03/20/20 1823  NA 135  K 3.3*  CL 97*  CO2 25  GLUCOSE 116*  BUN 15  CREATININE 1.12  CALCIUM 8.3*   GFR: Estimated Creatinine Clearance: 98.6 mL/min (by C-G formula based on SCr of 1.12 mg/dL). Liver Function Tests: Recent Labs  Lab 03/20/20 1823  AST 36  ALT 23  ALKPHOS 54  BILITOT 1.3*  PROT 7.9  ALBUMIN 3.9   No results for input(s): LIPASE, AMYLASE in the last 168 hours. No results for input(s): AMMONIA in the last 168 hours. Coagulation Profile: No results for input(s): INR, PROTIME in the last 168 hours. Cardiac Enzymes: No results for input(s): CKTOTAL, CKMB, CKMBINDEX, TROPONINI in the last 168 hours. BNP (last 3 results) No results for input(s): PROBNP in the last 8760 hours. HbA1C: No results for input(s): HGBA1C in the last 72 hours. CBG: No results for input(s): GLUCAP in the last 168 hours. Lipid Profile: No results for input(s): CHOL, HDL, LDLCALC, TRIG, CHOLHDL, LDLDIRECT in the last 72 hours. Thyroid Function Tests: No results for input(s): TSH, T4TOTAL, FREET4, T3FREE, THYROIDAB in the last 72 hours. Anemia Panel: No results for input(s): VITAMINB12, FOLATE, FERRITIN, TIBC, IRON, RETICCTPCT in the last 72 hours. Urine analysis:    Component Value Date/Time   COLORURINE YELLOW (A) 09/30/2019 1918   APPEARANCEUR CLEAR (A) 09/30/2019 1918   LABSPEC 1.028 09/30/2019 1918   PHURINE 5.0 09/30/2019 1918   GLUCOSEU NEGATIVE 09/30/2019 1918    GLUCOSEU NEGATIVE 07/02/2015 1632   HGBUR NEGATIVE 09/30/2019 1918   BILIRUBINUR NEGATIVE 09/30/2019 1918   KETONESUR 20 (A) 09/30/2019 1918   PROTEINUR 30 (A) 09/30/2019 1918   UROBILINOGEN 2.0 (A) 07/02/2015 1632   NITRITE NEGATIVE 09/30/2019 1918   LEUKOCYTESUR NEGATIVE 09/30/2019 1918   Sepsis Labs: @LABRCNTIP (procalcitonin:4,lacticidven:4) ) Recent Results (from the past 240 hour(s))  SARS Coronavirus 2 by RT PCR (hospital order, performed in [place] hospital lab) Nasopharyngeal Nasopharyngeal Swab     Status: Abnormal   Collection Time: 03/21/20  6:14 AM   Specimen: Nasopharyngeal Swab  Result Value Ref Range Status   SARS Coronavirus 2 POSITIVE (A) NEGATIVE Final    Comment: RESULT CALLED TO, READ BACK BY AND VERIFIED WITH: VALERIE CHANDLER AT 0818 03/21/20.PMF (NOTE) SARS-CoV-2 target nucleic acids are DETECTED  SARS-CoV-2 RNA is generally detectable in upper respiratory specimens  during the acute phase of infection.  Positive results are indicative  of the presence  of the identified virus, but do not rule out bacterial infection or co-infection with other pathogens not detected by the test.  Clinical correlation with patient history and  other diagnostic information is necessary to determine patient infection status.  The expected result is negative.  Fact Sheet for Patients:   StrictlyIdeas.no   Fact Sheet for Healthcare Providers:   BankingDealers.co.za    This test is not yet approved or cleared by the [place] FDA and  has been authorized for detection and/or diagnosis of SARS-CoV-2 by FDA under an Emergency Use Authorization (EUA).  This EUA will remain in effect (meaning this  test can be used) for the duration of  the COVID-19 declaration under Section 564(b)(1) of the Act, 21 U.S.C. section 360-bbb-3(b)(1), unless the authorization is terminated or revoked sooner.  Performed at [facility], [address], [place], [place] [address]      Radiological Exams on Admission: DG Chest 2 View  Result Date: 03/20/2020 CLINICAL DATA:  Shortness of breath, worsening cough, fatigue. EXAM: CHEST - 2 VIEW COMPARISON:  Chest x-ray dated 09/29/2012. FINDINGS: Bibasilar airspace opacities, most likely pneumonia. No pleural effusion or pneumothorax is seen. Heart size and mediastinal contours appear stable. Osseous structures about the chest are unremarkable. IMPRESSION: Probable bibasilar/multifocal pneumonia. Electronically Signed   By: Franki Cabot M.D.   On: 03/20/2020 19:31   CT Angio Chest PE W and/or Wo Contrast  Result Date: 03/21/2020 CLINICAL DATA:  PE suspected, high probability. Progressive cough, fatigue, and shortness of breath. Fever. COVID positive. EXAM: CT ANGIOGRAPHY CHEST WITH CONTRAST TECHNIQUE: Multidetector CT imaging of the chest was performed using the standard protocol during bolus administration of intravenous contrast. Multiplanar CT image reconstructions and MIPs were obtained to evaluate the vascular anatomy. CONTRAST:  184mL OMNIPAQUE IOHEXOL 350 MG/ML SOLN COMPARISON:  Two-view chest x-ray 03/20/2020 FINDINGS: Cardiovascular: Heart is mildly enlarged. No significant pericardial effusion is present. Aorta and great vessel origins are within normal limits. Pulmonary artery opacification is satisfactory. No focal filling defects are present to suggest pulmonary emboli. Pulmonary artery size is within normal limits. Mediastinum/Nodes: Subcentimeter paratracheal lymph nodes are noted. Mild lymphoid prominence is present at the hila bilaterally. Lungs/Pleura: Patchy airspace opacities are present bilaterally, mostly peripheral. Increased consolidation is present at the lung bases. Airways are patent. No significant pleural effusion or pneumothorax is present. Upper Abdomen: Visualized upper abdomen is unremarkable. Musculoskeletal: Exaggerated thoracic kyphosis is present.  Mild endplate degenerative changes are noted. Fusion at T3-4 may be congenital. Ribs and sternum are within normal limits. Review of the MIP images confirms the above findings. IMPRESSION: 1. No evidence for pulmonary embolus. 2. Patchy airspace opacities bilaterally, mostly peripheral. Findings are consistent with multifocal COVID-19 pneumonia. 3. Mild lymphoid prominence at the hila bilaterally and subcentimeter paratracheal nodes are likely reactive. 4. Mild cardiomegaly without failure. 5. Exaggerated thoracic kyphosis. Electronically Signed   By: San Morelle M.D.   On: 03/21/2020 05:06     EKG: Independently reviewed.  Sinus tachycardia, QTC 438, nonspecific T wave change.  Assessment/Plan Principal Problem:   Pneumonia due to COVID-19 virus Active Problems:   GERD (gastroesophageal reflux disease)   Acute respiratory failure with hypoxia (HCC)   Hypokalemia    Acute respiratory failure with hypoxia and pneumonia due to COVID-19 virus: Patient has oxygen desaturation to 87% on room air, currently 98% on 3 L nasal cannula oxygen. Patient has bilateral patchy infiltration on chest x-ray and CT angiogram. No PE on CT angiogram.  -  will admit to med-surg bed as inpt -Remdesivir per pharm -Solumedrol 60 mg bid -vitamin C, zinc.  -Bronchodilators -PRN Mucinex for cough -f/u Blood culture -Gentle IV fluid -D-dimer, BNP,Trop, LFT, CRP, LDH, Procalcitonin, Ferritin, fibinogen, TG, Hep B SAg, HIV ab -Daily CRP, Ferritin, D-dimer, -Will ask the patient to maintain an awake prone position for 16+ hours a day, if possible, with a minimum of 2-3 hours at a time -Will attempt to maintain euvolemia to a net negative fluid status -IF patient deteriorates, will consult PCCM and ID  GERD (gastroesophageal reflux disease) -prn Protonix  Hypokalemia: K= 3.3 on admission. - Repleted - Check Mg level    DVT ppx  SQ Lovenox Code Status: Full code Family Communication: not done, no  family member is at bed side.     Disposition Plan:  Anticipate discharge back to previous environment Consults called:  None Admission status: Med-surg bed as inpt       Status is: Inpatient  Remains inpatient appropriate because:Inpatient level of care appropriate due to severity of illness.  Patient has history of GERD, kidney stone, erectile dysfunction, now presents with acute respiratory failure with hypoxia secondary to pneumonia due to COVID-19 infection.  Patient also has hypokalemia.  His presentation is highly complicated.  Patient is at high risk for deteriorating.  Will need to be treated in hospital for at least 2 days.   Dispo: The patient is from: Home              Anticipated d/c is to: Home              Anticipated d/c date is: 2 days              Patient currently is not medically stable to d/c.          Date of Service 03/21/2020    Ivor Costa [facility]   If 7PM-7AM, please contact night-coverage [email or web address] 03/21/2020, 8:25 AM

## 2020-03-21 NOTE — ED Notes (Signed)
Meal tray given to pt. Pt also given Deoderant and toothbrush/toothpaste.

## 2020-03-21 NOTE — ED Notes (Signed)
Received verbal orders for ambulatory o2 sats. Pt at 94% on room air while at rest in bed. Sats dropped to 87% while ambulating around room. Pt returned to bed and placed on 3L via nasal canula. O2 sat improved to 96%

## 2020-03-21 NOTE — ED Notes (Signed)
Called dietary to get tray for pt

## 2020-03-21 NOTE — ED Notes (Addendum)
Pt given two cups of water. Offered to turn TV on.  Declined adjustment of bed at this time. Alert and oriented. NAD.  bathroom offered.

## 2020-03-21 NOTE — ED Notes (Signed)
Pt comfortable in bed watching TV

## 2020-03-21 NOTE — ED Notes (Signed)
Pt provided w lunch tray.

## 2020-03-21 NOTE — ED Provider Notes (Signed)
[facility] Emergency Department Provider Note  ____________________________________________  Time seen: Approximately 12:36 AM  I have reviewed the triage vital signs and the nursing notes.   HISTORY  Chief Complaint Cough, Shortness of Breath, Tachycardia, and Fatigue   HPI Kevin Gregory is a 44 y.o. male who presents for evaluation of shortness of breath after testing positive for Covid a week ago. Patient has had 10 days of sore throat, loss of taste or smell, diarrhea, fatigue, chills, fever, cough. Today he started having shortness of breath. Denies wheezing. He denies chest pain. He denies syncope or lightheadedness. No personal or family history of blood clots, recent travel immobilization, leg pain or swelling, hemoptysis, exogenous hormones. No prior history of lung disease. He shortness of breath is mostly present with exertion or when he has a coughing fit. Has very mild shortness of breath at rest.   Past Medical History:  Diagnosis Date  . Erectile dysfunction 12/01/2011  . GERD (gastroesophageal reflux disease)   . Kidney stones   . Nephrolithiasis     Patient Active Problem List   Diagnosis Date Noted  . Acute appendicitis 09/30/2019  . Right otitis media 10/05/2018  . Allergic rhinitis 10/05/2018  . Multiple contusions 02/03/2018  . Right lumbar radiculitis 01/12/2017  . Constipation 06/04/2012  . Erectile dysfunction 12/01/2011  . Nephrolithiasis   . GERD (gastroesophageal reflux disease)   . Preventative health care 11/30/2011    Past Surgical History:  Procedure Laterality Date  . LAPAROSCOPIC APPENDECTOMY N/A 10/01/2019   Procedure: APPENDECTOMY LAPAROSCOPIC;  Surgeon: Fredirick Maudlin, MD;  Location: [facility] ORS;  Service: General;  Laterality: N/A;    Prior to Admission medications   Medication Sig Start Date End Date Taking? Authorizing Provider  Creatine POWD 1 Scoop by Does not apply route See admin instructions. Take 1  scoop dissolved in water three times a week pre-workout. Patient not taking: Reported on 03/21/2020    [provider]    Allergies Oxycodone  Family History  Problem Relation Age of Onset  . Drug abuse Maternal Grandmother     Social History Social History   Tobacco Use  . Smoking status: Former Research scientist (life sciences)  . Smokeless tobacco: Never Used  Substance Use Topics  . Alcohol use: Yes    Comment: rare  . Drug use: No    Review of Systems  Constitutional: + fever, chills, loss of taste and smell, fatigue Eyes: Negative for visual changes. ENT: + sore throat. Neck: No neck pain  Cardiovascular: Negative for chest pain. Respiratory: + shortness of breath and cough Gastrointestinal: Negative for abdominal pain, vomiting. + nausea and diarrhea. Genitourinary: Negative for dysuria. Musculoskeletal: Negative for back pain. Skin: Negative for rash. Neurological: Negative for headaches, weakness or numbness. Psych: No SI or HI  ____________________________________________   PHYSICAL EXAM:  VITAL SIGNS: ED Triage Vitals  Enc Vitals Group     BP 03/20/20 1807 131/87     Pulse Rate 03/20/20 1807 (!) 135     Resp 03/20/20 1807 (!) 24     Temp 03/20/20 1807 (!) 102.7 F (39.3 C)     Temp Source 03/20/20 1807 Oral     SpO2 03/20/20 1807 94 %     Weight 03/20/20 1808 215 lb (97.5 kg)     Height 03/20/20 1808 5\' 10"  (1.778 m)     Head Circumference --      Peak Flow --      Pain Score 03/20/20 1807 4  Pain Loc --      Pain Edu? --      Excl. in [place]? --     Constitutional: Alert and oriented. Well appearing and in no apparent distress. HEENT:      Head: Normocephalic and atraumatic.         Eyes: Conjunctivae are normal. Sclera is non-icteric.       Mouth/Throat: Mucous membranes are moist.       Neck: Supple with no signs of meningismus. Cardiovascular: Regular rate and rhythm. No murmurs, gallops, or rubs. 2+ symmetrical distal pulses are present in all  extremities. No JVD. Respiratory: Mild increased WOB at rest, not hypoxic Gastrointestinal: Soft, non tender. Musculoskeletal: No edema, cyanosis, or erythema of extremities. Neurologic: Normal speech and language. Face is symmetric. Moving all extremities. No gross focal neurologic deficits are appreciated. Skin: Skin is warm, dry and intact. No rash noted. Psychiatric: Mood and affect are normal. Speech and behavior are normal.  ____________________________________________   LABS (all labs ordered are listed, but only abnormal results are displayed)  Labs Reviewed  CBC - Abnormal; Notable for the following components:      Result Value   Platelets 144 (*)    All other components within normal limits  COMPREHENSIVE METABOLIC PANEL - Abnormal; Notable for the following components:   Potassium 3.3 (*)    Chloride 97 (*)    Glucose, Bld 116 (*)    Calcium 8.3 (*)    Total Bilirubin 1.3 (*)    All other components within normal limits  FIBRIN DERIVATIVES D-DIMER ([facility] ONLY) - Abnormal; Notable for the following components:   Fibrin derivatives D-dimer ([facility]) 1,142.17 (*)    All other components within normal limits  SARS CORONAVIRUS 2 BY RT PCR (HOSPITAL ORDER, [facility])  PROCALCITONIN  TROPONIN I (HIGH SENSITIVITY)   ____________________________________________  EKG  ED ECG REPORT I, Rudene Re, the attending physician, personally viewed and interpreted this ECG.  Sinus tachycardia, rate of 135, normal intervals, normal axis, otherwise normal EKG. ____________________________________________  RADIOLOGY  I have personally reviewed the images performed during this visit and I agree with the Radiologist's read.   Interpretation by Radiologist:  DG Chest 2 View  Result Date: 03/20/2020 CLINICAL DATA:  Shortness of breath, worsening cough, fatigue. EXAM: CHEST - 2 VIEW COMPARISON:  Chest x-ray dated 09/29/2012. FINDINGS: Bibasilar  airspace opacities, most likely pneumonia. No pleural effusion or pneumothorax is seen. Heart size and mediastinal contours appear stable. Osseous structures about the chest are unremarkable. IMPRESSION: Probable bibasilar/multifocal pneumonia. Electronically Signed   By: Franki Cabot M.D.   On: 03/20/2020 19:31   CT Angio Chest PE W and/or Wo Contrast  Result Date: 03/21/2020 CLINICAL DATA:  PE suspected, high probability. Progressive cough, fatigue, and shortness of breath. Fever. COVID positive. EXAM: CT ANGIOGRAPHY CHEST WITH CONTRAST TECHNIQUE: Multidetector CT imaging of the chest was performed using the standard protocol during bolus administration of intravenous contrast. Multiplanar CT image reconstructions and MIPs were obtained to evaluate the vascular anatomy. CONTRAST:  158mL OMNIPAQUE IOHEXOL 350 MG/ML SOLN COMPARISON:  Two-view chest x-ray 03/20/2020 FINDINGS: Cardiovascular: Heart is mildly enlarged. No significant pericardial effusion is present. Aorta and great vessel origins are within normal limits. Pulmonary artery opacification is satisfactory. No focal filling defects are present to suggest pulmonary emboli. Pulmonary artery size is within normal limits. Mediastinum/Nodes: Subcentimeter paratracheal lymph nodes are noted. Mild lymphoid prominence is present at the hila bilaterally. Lungs/Pleura: Patchy airspace opacities are  present bilaterally, mostly peripheral. Increased consolidation is present at the lung bases. Airways are patent. No significant pleural effusion or pneumothorax is present. Upper Abdomen: Visualized upper abdomen is unremarkable. Musculoskeletal: Exaggerated thoracic kyphosis is present. Mild endplate degenerative changes are noted. Fusion at T3-4 may be congenital. Ribs and sternum are within normal limits. Review of the MIP images confirms the above findings. IMPRESSION: 1. No evidence for pulmonary embolus. 2. Patchy airspace opacities bilaterally, mostly  peripheral. Findings are consistent with multifocal COVID-19 pneumonia. 3. Mild lymphoid prominence at the hila bilaterally and subcentimeter paratracheal nodes are likely reactive. 4. Mild cardiomegaly without failure. 5. Exaggerated thoracic kyphosis. Electronically Signed   By: San Morelle M.D.   On: 03/21/2020 05:06     ____________________________________________   PROCEDURES  Procedure(s) performed:yes .1-3 Lead EKG Interpretation Performed by: Rudene Re, MD Authorized by: Rudene Re, MD     Interpretation: non-specific     ECG rate assessment: tachycardic     Rhythm: sinus tachycardia     Ectopy: none     Critical Care performed: yes  CRITICAL CARE Performed by: Rudene Re  ?  Total critical care time: 35 min  Critical care time was exclusive of separately billable procedures and treating other patients.  Critical care was necessary to treat or prevent imminent or life-threatening deterioration.  Critical care was time spent personally by me on the following activities: development of treatment plan with patient and/or surrogate as well as nursing, discussions with consultants, evaluation of patient's response to treatment, examination of patient, obtaining history from patient or surrogate, ordering and performing treatments and interventions, ordering and review of laboratory studies, ordering and review of radiographic studies, pulse oximetry and re-evaluation of patient's condition.  ____________________________________________   INITIAL IMPRESSION / ASSESSMENT AND PLAN / ED COURSE  44 y.o. male who presents for evaluation of shortness of breath after testing positive for Covid a week ago. Initially upon arrival to the emergency room patient noted to be tachycardic and febrile with a temp of 102.37F. He has mildly increased WOB and destas to 87% on RA with ambulation although sats are normal on RA at rest. Will initiate Decadron and  remdesivir. Patient placed on 2 L nasal cannula. Chest x-ray showing signs of multifocal pneumonia consistent with Covid, confirmed by radiology. EKG showing sinus tachycardia with no ischemia or dysrhythmias. Labs with no significant derangement. Troponin, D-dimer, procalcitonin pending to rule out myocarditis, demand ischemia, overlying PE, or overlying bacterial pneumonia. Patient given Tylenol for fever. Old medical records reviewed. Anticipate admission.  _________________________ 5:12 AM on 03/21/2020 -----------------------------------------  Procalcitonin negative.  Will hold antibiotics.  Troponin is negative therefore low suspicion for myocarditis.  D-dimer elevated so patient was sent for CT of the chest which is negative for PE.  Patient be admitted to the hospitalist service.    _____________________________________________ Please note:  Patient was evaluated in Emergency Department today for the symptoms described in the history of present illness. Patient was evaluated in the context of the global COVID-19 pandemic, which necessitated consideration that the patient might be at risk for infection with the SARS-CoV-2 virus that causes COVID-19. Institutional protocols and algorithms that pertain to the evaluation of patients at risk for COVID-19 are in a state of rapid change based on information released by regulatory bodies including the CDC and federal and state organizations. These policies and algorithms were followed during the patient's care in the ED.  Some ED evaluations and interventions may be delayed as a result of  limited staffing during the pandemic.   [place] Controlled Substance Database was reviewed by me. ____________________________________________   FINAL CLINICAL IMPRESSION(S) / ED DIAGNOSES   Final diagnoses:  Pneumonia due to COVID-19 virus  Acute respiratory failure with hypoxia ([place])      NEW MEDICATIONS STARTED DURING THIS VISIT:  ED Discharge Orders     None       Note:  This document was prepared using Dragon voice recognition software and may include unintentional dictation errors.    Alfred Levins, [place], MD 03/21/20 [phone]

## 2020-03-21 NOTE — ED Notes (Signed)
Called and ordered breakfast tray for pt.

## 2020-03-21 NOTE — ED Notes (Signed)
Pt's wife updated.

## 2020-03-21 NOTE — Progress Notes (Signed)
Remdesivir - Pharmacy Brief Note   O:  CXR: IMPRESSION: Probable bibasilar/multifocal pneumonia. SpO2: 94 - 96% on 3L [place]   A/P:  Remdesivir 200 mg IVPB once followed by 100 mg IVPB daily x 4 days.   Tobie Lords, PharmD, BCPS Clinical Pharmacist 03/21/2020 1:08 AM

## 2020-03-21 NOTE — ED Notes (Signed)
Pt comfortable watching TV in bed at this time.

## 2020-03-22 DIAGNOSIS — J1282 Pneumonia due to coronavirus disease 2019: Secondary | ICD-10-CM

## 2020-03-22 DIAGNOSIS — U071 COVID-19: Principal | ICD-10-CM

## 2020-03-22 LAB — CBC WITH DIFFERENTIAL/PLATELET
Abs Immature Granulocytes: 0.02 10*3/uL (ref 0.00–0.07)
Basophils Absolute: 0 10*3/uL (ref 0.0–0.1)
Basophils Relative: 0 %
Eosinophils Absolute: 0 10*3/uL (ref 0.0–0.5)
Eosinophils Relative: 0 %
HCT: 41.8 % (ref 39.0–52.0)
Hemoglobin: 14.5 g/dL (ref 13.0–17.0)
Immature Granulocytes: 0 %
Lymphocytes Relative: 14 %
Lymphs Abs: 0.6 10*3/uL — ABNORMAL LOW (ref 0.7–4.0)
MCH: 28.8 pg (ref 26.0–34.0)
MCHC: 34.7 g/dL (ref 30.0–36.0)
MCV: 83.1 fL (ref 80.0–100.0)
Monocytes Absolute: 0.2 10*3/uL (ref 0.1–1.0)
Monocytes Relative: 5 %
Neutro Abs: 3.7 10*3/uL (ref 1.7–7.7)
Neutrophils Relative %: 81 %
Platelets: 176 10*3/uL (ref 150–400)
RBC: 5.03 MIL/uL (ref 4.22–5.81)
RDW: 11.8 % (ref 11.5–15.5)
WBC: 4.6 10*3/uL (ref 4.0–10.5)
nRBC: 0 % (ref 0.0–0.2)

## 2020-03-22 LAB — C-REACTIVE PROTEIN: CRP: 8.5 mg/dL — ABNORMAL HIGH (ref <1.0)

## 2020-03-22 LAB — COMPREHENSIVE METABOLIC PANEL
ALT: 24 U/L (ref 0–44)
AST: 27 U/L (ref 15–41)
Albumin: 3.5 g/dL (ref 3.5–5.0)
Alkaline Phosphatase: 49 U/L (ref 38–126)
Anion gap: 9 (ref 5–15)
BUN: 17 mg/dL (ref 6–20)
CO2: 25 mmol/L (ref 22–32)
Calcium: 8.6 mg/dL — ABNORMAL LOW (ref 8.9–10.3)
Chloride: 106 mmol/L (ref 98–111)
Creatinine, Ser: 0.94 mg/dL (ref 0.61–1.24)
GFR calc Af Amer: >60 mL/min (ref >60)
GFR calc non Af Amer: >60 mL/min (ref >60)
Glucose, Bld: 171 mg/dL — ABNORMAL HIGH (ref 70–99)
Potassium: 3.9 mmol/L (ref 3.5–5.1)
Sodium: 140 mmol/L (ref 135–145)
Total Bilirubin: 0.8 mg/dL (ref 0.3–1.2)
Total Protein: 7.2 g/dL (ref 6.5–8.1)

## 2020-03-22 LAB — GLUCOSE, CAPILLARY
Glucose-Capillary: 248 mg/dL — ABNORMAL HIGH (ref 70–99)
Glucose-Capillary: 202 mg/dL — ABNORMAL HIGH (ref 70–99)

## 2020-03-22 LAB — FIBRIN DERIVATIVES D-DIMER (ARMC ONLY): Fibrin derivatives D-dimer (ARMC): 546.04 ng/mL (FEU) — ABNORMAL HIGH (ref 0.00–499.00)

## 2020-03-22 LAB — MAGNESIUM: Magnesium: 2.4 mg/dL (ref 1.7–2.4)

## 2020-03-22 LAB — HEMOGLOBIN A1C
Hgb A1c MFr Bld: 5.3 % (ref 4.8–5.6)
Mean Plasma Glucose: 105.41 mg/dL

## 2020-03-22 LAB — FERRITIN: Ferritin: 933 ng/mL — ABNORMAL HIGH (ref 24–336)

## 2020-03-22 MED ORDER — IVERMECTIN 3 MG PO TABS
150.0000 ug/kg | ORAL_TABLET | Freq: Once | ORAL | Status: AC
  Administered 2020-03-22: 15000 ug via ORAL
  Filled 2020-03-22: qty 5

## 2020-03-22 MED ORDER — SODIUM CHLORIDE 0.9 % IV SOLN
INTRAVENOUS | Status: DC | PRN
  Administered 2020-03-22 – 2020-03-23 (×2): 250 mL via INTRAVENOUS

## 2020-03-22 MED ORDER — INSULIN ASPART 100 UNIT/ML ~~LOC~~ SOLN
0.0000 [IU] | Freq: Every day | SUBCUTANEOUS | Status: DC
  Administered 2020-03-22 – 2020-03-23 (×2): 2 [IU] via SUBCUTANEOUS
  Filled 2020-03-22 (×2): qty 1

## 2020-03-22 MED ORDER — INSULIN ASPART 100 UNIT/ML ~~LOC~~ SOLN
0.0000 [IU] | Freq: Three times a day (TID) | SUBCUTANEOUS | Status: DC
  Administered 2020-03-22 – 2020-03-23 (×2): 5 [IU] via SUBCUTANEOUS
  Administered 2020-03-23: 3 [IU] via SUBCUTANEOUS
  Administered 2020-03-23: 17:00:00 5 [IU] via SUBCUTANEOUS
  Filled 2020-03-22 (×5): qty 1

## 2020-03-22 NOTE — Progress Notes (Signed)
PROGRESS NOTE    Kevin Gregory  FAO:[id number] DOB: 02-20-1976 DOA: 03/20/2020 PCP: Biagio Borg, MD   Brief Narrative:  Kevin Gregory is a 44 y.o. male with medical history significant of GERD, kidney stone, erectile dysfunction, who presents with fever, chills, cough, shortness of breath, worsening for the past 10 days. He was tested positive for COVID-19 1 week ago. Found to be hypoxic in high 80s on room air which improved with 3 L.  Elevated inflammatory markers and he was started on remdesivir and Decadron.  Patient is unvaccinated.  Subjective: Patient continued to have some cough.  Otherwise feeling little better.  He was asking about getting ivermectin.  Assessment & Plan:   Principal Problem:   Pneumonia due to COVID-19 virus Active Problems:   GERD (gastroesophageal reflux disease)   Acute respiratory failure with hypoxia (HCC)   Hypokalemia  Acute hypoxic respiratory failure secondary to COVID-19 pneumonia. Patient was saturating in mid 90s on 3 L, decreased to 2 and he continued to remain above 90%.  CTA negative for PE but did show bilateral groundglass opacities consistent with COVID-19 pneumonia.  Procalcitonin negative.  Mildly elevated D-dimer with negative CRP. -Continue remdesivir and Decadron-day 2 -Can give 1 dose of ivermectin. -Monitor inflammatory markers. -Continue supportive care -Try weaning him off from oxygen.  GERD. -Continue PPI  Hypokalemia.  Resolved. -Replete as needed and monitor.  Hyperglycemia.  Blood glucose elevated, no prior diagnosis of diabetes.  Patient is on steroid. -Check A1c -Add SSI  Objective: Vitals:   03/22/20 0040 03/22/20 0537 03/22/20 0903 03/22/20 1154  BP: 115/75 110/67 113/80 122/78  Pulse: 87 75 82 85  Resp: 18 18 (!) 22 (!) 22  Temp: 98 F (36.7 C) 98.7 F (37.1 C) 97.9 F (36.6 C) 98 F (36.7 C)  TempSrc: Oral Oral Oral Oral  SpO2: 96% 95% 96% 96%  Weight:      Height:        Intake/Output  Summary (Last 24 hours) at 03/22/2020 1333 Last data filed at 03/22/2020 1310 Gross per 24 hour  Intake 105.69 ml  Output 650 ml  Net -544.31 ml   Filed Weights   03/20/20 1808  Weight: 97.5 kg    Examination:  General exam: Appears calm and comfortable  Respiratory system: Clear to auscultation. Respiratory effort normal. Cardiovascular system: S1 & S2 heard, RRR. No JVD, murmurs, Gastrointestinal system: Soft, nontender, nondistended, bowel sounds positive. Central nervous system: Alert and oriented. No focal neurological deficits. Extremities: No edema, no cyanosis, pulses intact and symmetrical. Psychiatry: Judgement and insight appear normal. Mood & affect appropriate.    DVT prophylaxis: Lovenox Code Status: Full Family Communication: Wife was updated on phone. Disposition Plan:  Status is: Inpatient  Remains inpatient appropriate because:Inpatient level of care appropriate due to severity of illness   Dispo: The patient is from: Home              Anticipated d/c is to: Home              Anticipated d/c date is: 2 days              Patient currently is not medically stable to d/c.    Consultants:   None  Procedures:  Antimicrobials:   Data Reviewed: I have personally reviewed following labs and imaging studies  CBC: Recent Labs  Lab 03/20/20 1823 03/22/20 0629  WBC 4.6 4.6  NEUTROABS  --  3.7  HGB 14.5 14.5  HCT  41.9 41.8  MCV 84.3 83.1  PLT 144* 443   Basic Metabolic Panel: Recent Labs  Lab 03/20/20 1823 03/22/20 0629  NA 135 140  K 3.3* 3.9  CL 97* 106  CO2 25 25  GLUCOSE 116* 171*  BUN 15 17  CREATININE 1.12 0.94  CALCIUM 8.3* 8.6*  MG  --  2.4   GFR: Estimated Creatinine Clearance: 117.4 mL/min (by C-G formula based on SCr of 0.94 mg/dL). Liver Function Tests: Recent Labs  Lab 03/20/20 1823 03/22/20 0629  AST 36 27  ALT 23 24  ALKPHOS 54 49  BILITOT 1.3* 0.8  PROT 7.9 7.2  ALBUMIN 3.9 3.5   No results for input(s): LIPASE,  AMYLASE in the last 168 hours. No results for input(s): AMMONIA in the last 168 hours. Coagulation Profile: No results for input(s): INR, PROTIME in the last 168 hours. Cardiac Enzymes: No results for input(s): CKTOTAL, CKMB, CKMBINDEX, TROPONINI in the last 168 hours. BNP (last 3 results) No results for input(s): PROBNP in the last 8760 hours. HbA1C: No results for input(s): HGBA1C in the last 72 hours. CBG: No results for input(s): GLUCAP in the last 168 hours. Lipid Profile: Recent Labs    03/21/20 0838  TRIG 68   Thyroid Function Tests: No results for input(s): TSH, T4TOTAL, FREET4, T3FREE, THYROIDAB in the last 72 hours. Anemia Panel: Recent Labs    03/21/20 0838 03/22/20 0629  FERRITIN 860* 933*   Sepsis Labs: Recent Labs  Lab 03/21/20 0041  PROCALCITON <0.10    Recent Results (from the past 240 hour(s))  SARS Coronavirus 2 by RT PCR (hospital order, performed in [facility] hospital lab) Nasopharyngeal Nasopharyngeal Swab     Status: Abnormal   Collection Time: 03/21/20  6:14 AM   Specimen: Nasopharyngeal Swab  Result Value Ref Range Status   SARS Coronavirus 2 POSITIVE (A) NEGATIVE Final    Comment: RESULT CALLED TO, READ BACK BY AND VERIFIED WITH: VALERIE CHANDLER AT 1540 03/21/20.PMF (NOTE) SARS-CoV-2 target nucleic acids are DETECTED  SARS-CoV-2 RNA is generally detectable in upper respiratory specimens  during the acute phase of infection.  Positive results are indicative  of the presence of the identified virus, but do not rule out bacterial infection or co-infection with other pathogens not detected by the test.  Clinical correlation with patient history and  other diagnostic information is necessary to determine patient infection status.  The expected result is negative.  Fact Sheet for Patients:   StrictlyIdeas.no   Fact Sheet for Healthcare Providers:   BankingDealers.co.za    This test is not yet  approved or cleared by the [place] FDA and  has been authorized for detection and/or diagnosis of SARS-CoV-2 by FDA under an Emergency Use Authorization (EUA).  This EUA will remain in effect (meaning this  test can be used) for the duration of  the COVID-19 declaration under Section 564(b)(1) of the Act, 21 U.S.C. section 360-bbb-3(b)(1), unless the authorization is terminated or revoked sooner.  Performed at [facility], [address] [place], [place] [address]      Radiology Studies: DG Chest 2 View  Result Date: 03/20/2020 CLINICAL DATA:  Shortness of breath, worsening cough, fatigue. EXAM: CHEST - 2 VIEW COMPARISON:  Chest x-ray dated 09/29/2012. FINDINGS: Bibasilar airspace opacities, most likely pneumonia. No pleural effusion or pneumothorax is seen. Heart size and mediastinal contours appear stable. Osseous structures about the chest are unremarkable. IMPRESSION: Probable bibasilar/multifocal pneumonia. Electronically Signed   By: Franki Cabot  M.D.   On: 03/20/2020 19:31   CT Angio Chest PE W and/or Wo Contrast  Result Date: 03/21/2020 CLINICAL DATA:  PE suspected, high probability. Progressive cough, fatigue, and shortness of breath. Fever. COVID positive. EXAM: CT ANGIOGRAPHY CHEST WITH CONTRAST TECHNIQUE: Multidetector CT imaging of the chest was performed using the standard protocol during bolus administration of intravenous contrast. Multiplanar CT image reconstructions and MIPs were obtained to evaluate the vascular anatomy. CONTRAST:  145mL OMNIPAQUE IOHEXOL 350 MG/ML SOLN COMPARISON:  Two-view chest x-ray 03/20/2020 FINDINGS: Cardiovascular: Heart is mildly enlarged. No significant pericardial effusion is present. Aorta and great vessel origins are within normal limits. Pulmonary artery opacification is satisfactory. No focal filling defects are present to suggest pulmonary emboli. Pulmonary artery size is within normal limits. Mediastinum/Nodes: Subcentimeter  paratracheal lymph nodes are noted. Mild lymphoid prominence is present at the hila bilaterally. Lungs/Pleura: Patchy airspace opacities are present bilaterally, mostly peripheral. Increased consolidation is present at the lung bases. Airways are patent. No significant pleural effusion or pneumothorax is present. Upper Abdomen: Visualized upper abdomen is unremarkable. Musculoskeletal: Exaggerated thoracic kyphosis is present. Mild endplate degenerative changes are noted. Fusion at T3-4 may be congenital. Ribs and sternum are within normal limits. Review of the MIP images confirms the above findings. IMPRESSION: 1. No evidence for pulmonary embolus. 2. Patchy airspace opacities bilaterally, mostly peripheral. Findings are consistent with multifocal COVID-19 pneumonia. 3. Mild lymphoid prominence at the hila bilaterally and subcentimeter paratracheal nodes are likely reactive. 4. Mild cardiomegaly without failure. 5. Exaggerated thoracic kyphosis. Electronically Signed   By: San Morelle M.D.   On: 03/21/2020 05:06    Scheduled Meds: . vitamin C  500 mg Oral Daily  . enoxaparin (LOVENOX) injection  40 mg Subcutaneous Q24H  . ipratropium  2 puff Inhalation Q4H  . methylPREDNISolone (SOLU-MEDROL) injection  60 mg Intravenous Q12H  . zinc sulfate  220 mg Oral Daily   Continuous Infusions: . sodium chloride Stopped (03/22/20 1242)  . remdesivir 100 mg in NS 100 mL Stopped (03/22/20 1210)     LOS: 1 day   Time spent: 35 minutes.  Lorella Nimrod, MD [facility]  If 7PM-7AM, please contact night-coverage [email or web address]  03/22/2020, 1:33 PM   This record has been created using Systems analyst. Errors have been sought and corrected,but may not always be located. Such creation errors do not reflect on the standard of care.

## 2020-03-23 LAB — CBC WITH DIFFERENTIAL/PLATELET
Abs Immature Granulocytes: 0.08 10*3/uL — ABNORMAL HIGH (ref 0.00–0.07)
Basophils Absolute: 0 10*3/uL (ref 0.0–0.1)
Basophils Relative: 0 %
Eosinophils Absolute: 0 10*3/uL (ref 0.0–0.5)
Eosinophils Relative: 0 %
HCT: 45.6 % (ref 39.0–52.0)
Hemoglobin: 16.1 g/dL (ref 13.0–17.0)
Immature Granulocytes: 1 %
Lymphocytes Relative: 9 %
Lymphs Abs: 1.1 10*3/uL (ref 0.7–4.0)
MCH: 29.3 pg (ref 26.0–34.0)
MCHC: 35.3 g/dL (ref 30.0–36.0)
MCV: 82.9 fL (ref 80.0–100.0)
Monocytes Absolute: 0.4 10*3/uL (ref 0.1–1.0)
Monocytes Relative: 3 %
Neutro Abs: 10.5 10*3/uL — ABNORMAL HIGH (ref 1.7–7.7)
Neutrophils Relative %: 87 %
Platelets: 308 10*3/uL (ref 150–400)
RBC: 5.5 MIL/uL (ref 4.22–5.81)
RDW: 11.8 % (ref 11.5–15.5)
WBC: 12.1 10*3/uL — ABNORMAL HIGH (ref 4.0–10.5)
nRBC: 0 % (ref 0.0–0.2)

## 2020-03-23 LAB — COMPREHENSIVE METABOLIC PANEL
ALT: 30 U/L (ref 0–44)
AST: 31 U/L (ref 15–41)
Albumin: 3.7 g/dL (ref 3.5–5.0)
Alkaline Phosphatase: 51 U/L (ref 38–126)
Anion gap: 15 (ref 5–15)
BUN: 22 mg/dL — ABNORMAL HIGH (ref 6–20)
CO2: 25 mmol/L (ref 22–32)
Calcium: 8.8 mg/dL — ABNORMAL LOW (ref 8.9–10.3)
Chloride: 104 mmol/L (ref 98–111)
Creatinine, Ser: 1.06 mg/dL (ref 0.61–1.24)
GFR calc Af Amer: >60 mL/min (ref >60)
GFR calc non Af Amer: >60 mL/min (ref >60)
Glucose, Bld: 180 mg/dL — ABNORMAL HIGH (ref 70–99)
Potassium: 3.5 mmol/L (ref 3.5–5.1)
Sodium: 144 mmol/L (ref 135–145)
Total Bilirubin: 1 mg/dL (ref 0.3–1.2)
Total Protein: 7.8 g/dL (ref 6.5–8.1)

## 2020-03-23 LAB — GLUCOSE, CAPILLARY
Glucose-Capillary: 159 mg/dL — ABNORMAL HIGH (ref 70–99)
Glucose-Capillary: 216 mg/dL — ABNORMAL HIGH (ref 70–99)
Glucose-Capillary: 223 mg/dL — ABNORMAL HIGH (ref 70–99)
Glucose-Capillary: 219 mg/dL — ABNORMAL HIGH (ref 70–99)

## 2020-03-23 LAB — FERRITIN: Ferritin: 875 ng/mL — ABNORMAL HIGH (ref 24–336)

## 2020-03-23 LAB — FIBRIN DERIVATIVES D-DIMER (ARMC ONLY): Fibrin derivatives D-dimer (ARMC): 440.51 ng/mL (FEU) (ref 0.00–499.00)

## 2020-03-23 LAB — C-REACTIVE PROTEIN: CRP: 3.7 mg/dL — ABNORMAL HIGH (ref <1.0)

## 2020-03-23 NOTE — Progress Notes (Signed)
Pt ambulated in the room on RA with NT.  O2 sats at rest 92%.  Per NT during ambulation O2 sats dropped to 85%, HR up to 140 and pt c/o mid chest tightness.  2L O2 reapplied.  Pt reports slight mid chest tightness, denies pain.  HR NSR in the 90's and O2 sats 93% on 2L O2 per [place].  Dr Reesa Chew notified of the above.  No new orders at this time.

## 2020-03-23 NOTE — Progress Notes (Signed)
PROGRESS NOTE    Kevin Gregory  WNI:[id number] DOB: February 25, 1976 DOA: 03/20/2020 PCP: Biagio Borg, MD   Brief Narrative:  Kevin Gregory is a 44 y.o. male with medical history significant of GERD, kidney stone, erectile dysfunction, who presents with fever, chills, cough, shortness of breath, worsening for the past 10 days. He was tested positive for COVID-19 1 week ago. Found to be hypoxic in high 80s on room air which improved with 3 L.  Elevated inflammatory markers and he was started on remdesivir and Decadron.  Patient is unvaccinated.  Subjective: Patient was saturating 98 to 99% on 2 L, got upset when I removed the oxygen, maintaining saturation in mid 90s on room air while I was in the room.  Discussed that if he maintain saturation with ambulation I can discharge him home to complete remdesivir as an outpatient.  Patient got very upset and rude stating that I will stay here to complete  the time of my quarantine and you cannot put me on streets.  He was again demanding ivermectin, I told him that I gave him one-time dose of ivermectin yesterday and there is not much evidence at this time for continuation of ivermectin.  Per patient he does not care about any evidence and wants to get ivermectin. He started shouting.  Told the patient that we will not tolerate this aggressive behavior, as it was his choice not to get vaccine and hospital run by evidence not by other nonphysician people telling us. Discussed with charge nurse and apparently patient had similar behavior with the nursing staff earlier.  Assessment & Plan:   Principal Problem:   Pneumonia due to COVID-19 virus Active Problems:   GERD (gastroesophageal reflux disease)   Acute respiratory failure with hypoxia (HCC)   Hypokalemia  Acute hypoxic respiratory failure secondary to COVID-19 pneumonia. Patient was saturating in high 90s on 2 L, continue to maintain saturation on room air, do desaturate up to high 80s with  ambulation.  CTA negative for PE but did show bilateral groundglass opacities consistent with COVID-19 pneumonia.  Procalcitonin negative.  Mildly elevated D-dimer with negative CRP.  Received 1 dose of ivermectin. -Continue remdesivir and Decadron-day 3 -Monitor inflammatory markers. -Continue supportive care -Try weaning him off from oxygen.  GERD. -Continue PPI  Hypokalemia.  Resolved. -Replete as needed and monitor.  Hyperglycemia.  Blood glucose elevated, no prior diagnosis of diabetes.  Patient is on steroid. -Check A1c- 5.3 -Continue SSI  Objective: Vitals:   03/23/20 0056 03/23/20 0436 03/23/20 0900 03/23/20 1233  BP: 132/78 119/84 119/80 120/69  Pulse: 74 85 91 89  Resp:  16 (!) 25 18  Temp: 98.7 F (37.1 C) 97.6 F (36.4 C) 98.9 F (37.2 C) 98.4 F (36.9 C)  TempSrc:  Oral Oral Oral  SpO2: 94% 97% 96% 96%  Weight:      Height:        Intake/Output Summary (Last 24 hours) at 03/23/2020 1325 Last data filed at 03/23/2020 3818 Gross per 24 hour  Intake 29.27 ml  Output --  Net 29.27 ml   Filed Weights   03/20/20 1808  Weight: 97.5 kg    Examination:  General.  Well-developed gentleman, in no acute distress. Pulmonary.  Lungs clear bilaterally, normal respiratory effort. CV.  Regular rate and rhythm, no JVD, rub or murmur. Abdomen.  Soft, nontender, nondistended, BS positive. CNS.  Alert and oriented x3.  No focal neurologic deficit. Extremities.  No edema, no cyanosis, pulses intact  and symmetrical. Psychiatry.  Judgment and insight appears normal.  DVT prophylaxis: Lovenox Code Status: Full Family Communication: Discussed with patient Disposition Plan:  Status is: Inpatient  Remains inpatient appropriate because:Inpatient level of care appropriate due to severity of illness   Dispo: The patient is from: Home              Anticipated d/c is to: Home              Anticipated d/c date is: 1 day.              Patient currently is not medically  stable to d/c.    Consultants:   None  Procedures:  Antimicrobials:   Data Reviewed: I have personally reviewed following labs and imaging studies  CBC: Recent Labs  Lab 03/20/20 1823 03/22/20 0629 03/23/20 0729  WBC 4.6 4.6 12.1*  NEUTROABS  --  3.7 10.5*  HGB 14.5 14.5 16.1  HCT 41.9 41.8 45.6  MCV 84.3 83.1 82.9  PLT 144* 176 096   Basic Metabolic Panel: Recent Labs  Lab 03/20/20 1823 03/22/20 0629 03/23/20 0729  NA 135 140 144  K 3.3* 3.9 3.5  CL 97* 106 104  CO2 25 25 25   GLUCOSE 116* 171* 180*  BUN 15 17 22*  CREATININE 1.12 0.94 1.06  CALCIUM 8.3* 8.6* 8.8*  MG  --  2.4  --    GFR: Estimated Creatinine Clearance: 104.2 mL/min (by C-G formula based on SCr of 1.06 mg/dL). Liver Function Tests: Recent Labs  Lab 03/20/20 1823 03/22/20 0629 03/23/20 0729  AST 36 27 31  ALT 23 24 30   ALKPHOS 54 49 51  BILITOT 1.3* 0.8 1.0  PROT 7.9 7.2 7.8  ALBUMIN 3.9 3.5 3.7   No results for input(s): LIPASE, AMYLASE in the last 168 hours. No results for input(s): AMMONIA in the last 168 hours. Coagulation Profile: No results for input(s): INR, PROTIME in the last 168 hours. Cardiac Enzymes: No results for input(s): CKTOTAL, CKMB, CKMBINDEX, TROPONINI in the last 168 hours. BNP (last 3 results) No results for input(s): PROBNP in the last 8760 hours. HbA1C: Recent Labs    03/22/20 0629  HGBA1C 5.3   CBG: Recent Labs  Lab 03/22/20 1704 03/22/20 2113 03/23/20 0810 03/23/20 1241  GLUCAP 248* 202* 159* 216*   Lipid Profile: Recent Labs    03/21/20 0838  TRIG 68   Thyroid Function Tests: No results for input(s): TSH, T4TOTAL, FREET4, T3FREE, THYROIDAB in the last 72 hours. Anemia Panel: Recent Labs    03/22/20 0629 03/23/20 0729  FERRITIN 933* 875*   Sepsis Labs: Recent Labs  Lab 03/21/20 0041  PROCALCITON <0.10    Recent Results (from the past 240 hour(s))  SARS Coronavirus 2 by RT PCR (hospital order, performed in [facility] hospital  lab) Nasopharyngeal Nasopharyngeal Swab     Status: Abnormal   Collection Time: 03/21/20  6:14 AM   Specimen: Nasopharyngeal Swab  Result Value Ref Range Status   SARS Coronavirus 2 POSITIVE (A) NEGATIVE Final    Comment: RESULT CALLED TO, READ BACK BY AND VERIFIED WITH: VALERIE CHANDLER AT 0454 03/21/20.PMF (NOTE) SARS-CoV-2 target nucleic acids are DETECTED  SARS-CoV-2 RNA is generally detectable in upper respiratory specimens  during the acute phase of infection.  Positive results are indicative  of the presence of the identified virus, but do not rule out bacterial infection or co-infection with other pathogens not detected by the test.  Clinical correlation with patient history and  other diagnostic information is necessary to determine patient infection status.  The expected result is negative.  Fact Sheet for Patients:   StrictlyIdeas.no   Fact Sheet for Healthcare Providers:   BankingDealers.co.za    This test is not yet approved or cleared by the [place] FDA and  has been authorized for detection and/or diagnosis of SARS-CoV-2 by FDA under an Emergency Use Authorization (EUA).  This EUA will remain in effect (meaning this  test can be used) for the duration of  the COVID-19 declaration under Section 564(b)(1) of the Act, 21 U.S.C. section 360-bbb-3(b)(1), unless the authorization is terminated or revoked sooner.  Performed at [facility], [address], [place], [place] [address]      Radiology Studies: No results found.  Scheduled Meds: . vitamin C  500 mg Oral Daily  . enoxaparin (LOVENOX) injection  40 mg Subcutaneous Q24H  . insulin aspart  0-15 Units Subcutaneous TID WC  . insulin aspart  0-5 Units Subcutaneous QHS  . ipratropium  2 puff Inhalation Q4H  . methylPREDNISolone (SOLU-MEDROL) injection  60 mg Intravenous Q12H  . zinc sulfate  220 mg Oral Daily   Continuous Infusions: . sodium  chloride Stopped (03/23/20 0912)  . remdesivir 100 mg in NS 100 mL 200 mL/hr at 03/23/20 0923     LOS: 2 days   Time spent: 35 minutes.  Lorella Nimrod, MD [facility]  If 7PM-7AM, please contact night-coverage [email or web address]  03/23/2020, 1:25 PM   This record has been created using Systems analyst. Errors have been sought and corrected,but may not always be located. Such creation errors do not reflect on the standard of care.

## 2020-03-24 LAB — CBC WITH DIFFERENTIAL/PLATELET
Abs Immature Granulocytes: 0.19 10*3/uL — ABNORMAL HIGH (ref 0.00–0.07)
Basophils Absolute: 0 10*3/uL (ref 0.0–0.1)
Basophils Relative: 0 %
Eosinophils Absolute: 0 10*3/uL (ref 0.0–0.5)
Eosinophils Relative: 0 %
HCT: 42.8 % (ref 39.0–52.0)
Hemoglobin: 14.3 g/dL (ref 13.0–17.0)
Immature Granulocytes: 2 %
Lymphocytes Relative: 9 %
Lymphs Abs: 0.8 10*3/uL (ref 0.7–4.0)
MCH: 29.2 pg (ref 26.0–34.0)
MCHC: 33.4 g/dL (ref 30.0–36.0)
MCV: 87.3 fL (ref 80.0–100.0)
Monocytes Absolute: 0.3 10*3/uL (ref 0.1–1.0)
Monocytes Relative: 4 %
Neutro Abs: 7.8 10*3/uL — ABNORMAL HIGH (ref 1.7–7.7)
Neutrophils Relative %: 85 %
Platelets: 218 10*3/uL (ref 150–400)
RBC: 4.9 MIL/uL (ref 4.22–5.81)
RDW: 11.8 % (ref 11.5–15.5)
WBC: 9.2 10*3/uL (ref 4.0–10.5)
nRBC: 0 % (ref 0.0–0.2)

## 2020-03-24 LAB — COMPREHENSIVE METABOLIC PANEL
ALT: 66 U/L — ABNORMAL HIGH (ref 0–44)
AST: 41 U/L (ref 15–41)
Albumin: 3.4 g/dL — ABNORMAL LOW (ref 3.5–5.0)
Alkaline Phosphatase: 47 U/L (ref 38–126)
Anion gap: 12 (ref 5–15)
BUN: 22 mg/dL — ABNORMAL HIGH (ref 6–20)
CO2: 24 mmol/L (ref 22–32)
Calcium: 8.4 mg/dL — ABNORMAL LOW (ref 8.9–10.3)
Chloride: 104 mmol/L (ref 98–111)
Creatinine, Ser: 1.07 mg/dL (ref 0.61–1.24)
GFR calc Af Amer: >60 mL/min (ref >60)
GFR calc non Af Amer: >60 mL/min (ref >60)
Glucose, Bld: 182 mg/dL — ABNORMAL HIGH (ref 70–99)
Potassium: 4.3 mmol/L (ref 3.5–5.1)
Sodium: 140 mmol/L (ref 135–145)
Total Bilirubin: 0.9 mg/dL (ref 0.3–1.2)
Total Protein: 6.8 g/dL (ref 6.5–8.1)

## 2020-03-24 LAB — FIBRIN DERIVATIVES D-DIMER (ARMC ONLY): Fibrin derivatives D-dimer (ARMC): 353.72 ng/mL (FEU) (ref 0.00–499.00)

## 2020-03-24 LAB — FERRITIN: Ferritin: 648 ng/mL — ABNORMAL HIGH (ref 24–336)

## 2020-03-24 LAB — C-REACTIVE PROTEIN: CRP: 1.4 mg/dL — ABNORMAL HIGH (ref <1.0)

## 2020-03-24 LAB — GLUCOSE, CAPILLARY: Glucose-Capillary: 170 mg/dL — ABNORMAL HIGH (ref 70–99)

## 2020-03-24 MED ORDER — ALBUTEROL SULFATE HFA 108 (90 BASE) MCG/ACT IN AERS: 2.0000 | INHALATION_SPRAY | RESPIRATORY_TRACT | 0 refills | Status: DC | PRN

## 2020-03-24 MED ORDER — DM-GUAIFENESIN ER 30-600 MG PO TB12: 1.0000 | ORAL_TABLET | Freq: Two times a day (BID) | ORAL | 0 refills | Status: DC | PRN

## 2020-03-24 NOTE — Discharge Instructions (Signed)
You are scheduled for a Remdesivir infusion on 8/17 at 2pm.  Please come to [place], you will see a COVID infusion banner by the road.  Enter there and turn left.  There are marked spaces for Infusion.  Call the number on the sign or [phone] and someone will come out and bring you inside.  If someone is driving you please come to the same area and call the number and someone will come outside to get you. Thank you!

## 2020-03-24 NOTE — Discharge Summary (Addendum)
Physician Discharge Summary  Kevin Gregory [id number] DOB: 12-02-1975 DOA: 03/20/2020  PCP: Biagio Borg, MD  Admit date: 03/20/2020 Discharge date: 03/24/2020  Admitted From: Home Disposition:  Home  Recommendations for Outpatient Follow-up:  1. Follow up with PCP in 1-2 weeks 2. Please obtain BMP/CBC in one week 3. Please follow up on the following pending results:None  Home Health:No  Equipment/Devices: Home oxygen at patient's request. Discharge Condition: Stable CODE STATUS: Full Diet recommendation: Heart Healthy / Carb Modified   Brief/Interim Summary: Zafar Debrosse Espositois a 44 y.o.malewith medical history significant ofGERD, kidney stone, erectile dysfunction, who presents with fever, chills, cough, shortness of breath, worsening for the past 10 days. He was tested positive for COVID-19 1 week ago. Found to be hypoxic in high 80s on room air which improved with 3 L.  Elevated inflammatory markers and he was started on remdesivir and Decadron.  He completed 4 doses of remdesivir and was set up at outpatient infusion center in [facility], [place] for the final dose tomorrow.  Patient is unvaccinated. He was able to weaned off from oxygen at rest, continue to show a mild hypoxia with ambulation which resolved quickly.  He was provided with prescription for home oxygen as needed at his request.  Patient had multiple behavioral issues during hospitalization.  Becoming very aggressive and shouting on nursing staff and on myself.  Demanding to give ivermectin.  He did receive one-time dose of ivermectin while in the hospital.  As it is not recommended at this time, I refused to provide him with this prescription and he got really upset and started shouting at me that this is his body and he understand what will work for him.  He was also very upset when I asked him about his vaccination status and advised him to get vaccine as this is now a disease of unvaccinated  people mostly, stating that I do not believe in vaccine and you cannot make me get it.  I clearly told him that I cannot force him but this is just an advice.  Patient did show some hyperglycemia without the diagnosis of diabetes.  A1c was checked and it was 5.3.  Blood glucose was managed with SSI while in the hospital.  Most likely secondary to steroid.  He needs a close follow-up with his primary care provider for further management.  Discharge Diagnoses:  Principal Problem:   Pneumonia due to COVID-19 virus Active Problems:   GERD (gastroesophageal reflux disease)   Acute respiratory failure with hypoxia (HCC)   Hypokalemia   Discharge Instructions  Discharge Instructions    Diet - low sodium heart healthy   Complete by: As directed    Discharge instructions   Complete by: As directed    Your last dose of remdesivir has been arranged at infusion center in [facility], [place]. Please follow the directions.   Increase activity slowly   Complete by: As directed      Allergies as of 03/24/2020      Reactions   Oxycodone Rash      Medication List    TAKE these medications   albuterol 108 (90 Base) MCG/ACT inhaler Commonly known as: VENTOLIN HFA Inhale 2 puffs into the lungs every 4 (four) hours as needed for wheezing or shortness of breath.   Creatine Powd 1 Scoop by Does not apply route See admin instructions. Take 1 scoop dissolved in water three times a week pre-workout.   dextromethorphan-guaiFENesin 30-600 MG 12hr  tablet Commonly known as: MUCINEX DM Take 1 tablet by mouth 2 (two) times daily as needed for cough.            Durable Medical Equipment  (From admission, onward)         Start     Ordered   03/24/20 1118  For home use only DME oxygen  Once       Question Answer Comment  Length of Need 6 Months   Mode or (Route) Nasal cannula   Liters per Minute 2   Oxygen delivery system Gas      03/24/20 1118          Follow-up  Information    Biagio Borg, MD. Schedule an appointment as soon as possible for a visit.   Specialties: Internal Medicine, Radiology Contact information: [place] [address] [phone]              Allergies  Allergen Reactions  . Oxycodone Rash    Consultations:  None  Procedures/Studies: DG Chest 2 View  Result Date: 03/20/2020 CLINICAL DATA:  Shortness of breath, worsening cough, fatigue. EXAM: CHEST - 2 VIEW COMPARISON:  Chest x-ray dated 09/29/2012. FINDINGS: Bibasilar airspace opacities, most likely pneumonia. No pleural effusion or pneumothorax is seen. Heart size and mediastinal contours appear stable. Osseous structures about the chest are unremarkable. IMPRESSION: Probable bibasilar/multifocal pneumonia. Electronically Signed   By: Franki Cabot M.D.   On: 03/20/2020 19:31   CT Angio Chest PE W and/or Wo Contrast  Result Date: 03/21/2020 CLINICAL DATA:  PE suspected, high probability. Progressive cough, fatigue, and shortness of breath. Fever. COVID positive. EXAM: CT ANGIOGRAPHY CHEST WITH CONTRAST TECHNIQUE: Multidetector CT imaging of the chest was performed using the standard protocol during bolus administration of intravenous contrast. Multiplanar CT image reconstructions and MIPs were obtained to evaluate the vascular anatomy. CONTRAST:  181mL OMNIPAQUE IOHEXOL 350 MG/ML SOLN COMPARISON:  Two-view chest x-ray 03/20/2020 FINDINGS: Cardiovascular: Heart is mildly enlarged. No significant pericardial effusion is present. Aorta and great vessel origins are within normal limits. Pulmonary artery opacification is satisfactory. No focal filling defects are present to suggest pulmonary emboli. Pulmonary artery size is within normal limits. Mediastinum/Nodes: Subcentimeter paratracheal lymph nodes are noted. Mild lymphoid prominence is present at the hila bilaterally. Lungs/Pleura: Patchy airspace opacities are present bilaterally, mostly peripheral.  Increased consolidation is present at the lung bases. Airways are patent. No significant pleural effusion or pneumothorax is present. Upper Abdomen: Visualized upper abdomen is unremarkable. Musculoskeletal: Exaggerated thoracic kyphosis is present. Mild endplate degenerative changes are noted. Fusion at T3-4 may be congenital. Ribs and sternum are within normal limits. Review of the MIP images confirms the above findings. IMPRESSION: 1. No evidence for pulmonary embolus. 2. Patchy airspace opacities bilaterally, mostly peripheral. Findings are consistent with multifocal COVID-19 pneumonia. 3. Mild lymphoid prominence at the hila bilaterally and subcentimeter paratracheal nodes are likely reactive. 4. Mild cardiomegaly without failure. 5. Exaggerated thoracic kyphosis. Electronically Signed   By: San Morelle M.D.   On: 03/21/2020 05:06     Subjective: Patient was again demanding ivermectin when I entered the room.  He started shouting when I again told him that it is not recommended at this time and he is getting the standard therapy and improving.  Discharge Exam: Vitals:   03/24/20 0505 03/24/20 0817  BP: 110/76 (!) 135/100  Pulse: 75 85  Resp: 16 20  Temp: 98 F (36.7 C) 98.1 F (36.7 C)  SpO2:  96% 97%   Vitals:   03/23/20 2006 03/24/20 0022 03/24/20 0505 03/24/20 0817  BP: 90/72 (!) 102/56 110/76 (!) 135/100  Pulse: 87 (!) 56 75 85  Resp: 16 16 16 20   Temp: 98.2 F (36.8 C) 97.7 F (36.5 C) 98 F (36.7 C) 98.1 F (36.7 C)  TempSrc: Oral Oral Oral Oral  SpO2: 97% 96% 96% 97%  Weight:      Height:        General: Pt is alert, awake, not in acute distress Cardiovascular: RRR, S1/S2 +, no rubs, no gallops Respiratory: CTA bilaterally, no wheezing, no rhonchi Abdominal: Soft, NT, ND, bowel sounds + Extremities: no edema, no cyanosis   The results of significant diagnostics from this hospitalization (including imaging, microbiology, ancillary and laboratory) are listed  below for reference.    Microbiology: Recent Results (from the past 240 hour(s))  SARS Coronavirus 2 by RT PCR (hospital order, performed in [facility] hospital lab) Nasopharyngeal Nasopharyngeal Swab     Status: Abnormal   Collection Time: 03/21/20  6:14 AM   Specimen: Nasopharyngeal Swab  Result Value Ref Range Status   SARS Coronavirus 2 POSITIVE (A) NEGATIVE Final    Comment: RESULT CALLED TO, READ BACK BY AND VERIFIED WITH: VALERIE CHANDLER AT 0818 03/21/20.PMF (NOTE) SARS-CoV-2 target nucleic acids are DETECTED  SARS-CoV-2 RNA is generally detectable in upper respiratory specimens  during the acute phase of infection.  Positive results are indicative  of the presence of the identified virus, but do not rule out bacterial infection or co-infection with other pathogens not detected by the test.  Clinical correlation with patient history and  other diagnostic information is necessary to determine patient infection status.  The expected result is negative.  Fact Sheet for Patients:   StrictlyIdeas.no   Fact Sheet for Healthcare Providers:   BankingDealers.co.za    This test is not yet approved or cleared by the [place] FDA and  has been authorized for detection and/or diagnosis of SARS-CoV-2 by FDA under an Emergency Use Authorization (EUA).  This EUA will remain in effect (meaning this  test can be used) for the duration of  the COVID-19 declaration under Section 564(b)(1) of the Act, 21 U.S.C. section 360-bbb-3(b)(1), unless the authorization is terminated or revoked sooner.  Performed at [facility], [address] [place], [place] [address]      Labs: BNP (last 3 results) Recent Labs    03/21/20 0838  BNP 10.1   Basic Metabolic Panel: Recent Labs  Lab 03/20/20 1823 03/22/20 0629 03/23/20 0729 03/24/20 0532  NA 135 140 144 140  K 3.3* 3.9 3.5 4.3  CL 97* 106 104 104  CO2 25 25 25 24    GLUCOSE 116* 171* 180* 182*  BUN 15 17 22* 22*  CREATININE 1.12 0.94 1.06 1.07  CALCIUM 8.3* 8.6* 8.8* 8.4*  MG  --  2.4  --   --    Liver Function Tests: Recent Labs  Lab 03/20/20 1823 03/22/20 0629 03/23/20 0729 03/24/20 0532  AST 36 27 31 41  ALT 23 24 30  66*  ALKPHOS 54 49 51 47  BILITOT 1.3* 0.8 1.0 0.9  PROT 7.9 7.2 7.8 6.8  ALBUMIN 3.9 3.5 3.7 3.4*   No results for input(s): LIPASE, AMYLASE in the last 168 hours. No results for input(s): AMMONIA in the last 168 hours. CBC: Recent Labs  Lab 03/20/20 1823 03/22/20 0629 03/23/20 0729 03/24/20 0532  WBC 4.6 4.6 12.1* 9.2  NEUTROABS  --  3.7 10.5* 7.8*  HGB 14.5 14.5 16.1 14.3  HCT 41.9 41.8 45.6 42.8  MCV 84.3 83.1 82.9 87.3  PLT 144* 176 308 218   Cardiac Enzymes: No results for input(s): CKTOTAL, CKMB, CKMBINDEX, TROPONINI in the last 168 hours. BNP: Invalid input(s): POCBNP CBG: Recent Labs  Lab 03/23/20 0810 03/23/20 1241 03/23/20 1640 03/23/20 2053 03/24/20 0819  GLUCAP 159* 216* 223* 219* 170*   D-Dimer No results for input(s): DDIMER in the last 72 hours. Hgb A1c Recent Labs    03/22/20 0629  HGBA1C 5.3   Lipid Profile No results for input(s): CHOL, HDL, LDLCALC, TRIG, CHOLHDL, LDLDIRECT in the last 72 hours. Thyroid function studies No results for input(s): TSH, T4TOTAL, T3FREE, THYROIDAB in the last 72 hours.  Invalid input(s): FREET3 Anemia work up Recent Labs    03/23/20 0729 03/24/20 0532  FERRITIN 875* 648*   Urinalysis    Component Value Date/Time   COLORURINE YELLOW (A) 09/30/2019 1918   APPEARANCEUR CLEAR (A) 09/30/2019 1918   LABSPEC 1.028 09/30/2019 1918   PHURINE 5.0 09/30/2019 1918   GLUCOSEU NEGATIVE 09/30/2019 1918   GLUCOSEU NEGATIVE 07/02/2015 1632   HGBUR NEGATIVE 09/30/2019 1918   BILIRUBINUR NEGATIVE 09/30/2019 1918   KETONESUR 20 (A) 09/30/2019 1918   PROTEINUR 30 (A) 09/30/2019 1918   UROBILINOGEN 2.0 (A) 07/02/2015 1632   NITRITE NEGATIVE  09/30/2019 1918   LEUKOCYTESUR NEGATIVE 09/30/2019 1918   Sepsis Labs Invalid input(s): PROCALCITONIN,  WBC,  LACTICIDVEN Microbiology Recent Results (from the past 240 hour(s))  SARS Coronavirus 2 by RT PCR (hospital order, performed in [place] hospital lab) Nasopharyngeal Nasopharyngeal Swab     Status: Abnormal   Collection Time: 03/21/20  6:14 AM   Specimen: Nasopharyngeal Swab  Result Value Ref Range Status   SARS Coronavirus 2 POSITIVE (A) NEGATIVE Final    Comment: RESULT CALLED TO, READ BACK BY AND VERIFIED WITH: VALERIE CHANDLER AT 0818 03/21/20.PMF (NOTE) SARS-CoV-2 target nucleic acids are DETECTED  SARS-CoV-2 RNA is generally detectable in upper respiratory specimens  during the acute phase of infection.  Positive results are indicative  of the presence of the identified virus, but do not rule out bacterial infection or co-infection with other pathogens not detected by the test.  Clinical correlation with patient history and  other diagnostic information is necessary to determine patient infection status.  The expected result is negative.  Fact Sheet for Patients:   StrictlyIdeas.no   Fact Sheet for Healthcare Providers:   BankingDealers.co.za    This test is not yet approved or cleared by the [place] FDA and  has been authorized for detection and/or diagnosis of SARS-CoV-2 by FDA under an Emergency Use Authorization (EUA).  This EUA will remain in effect (meaning this  test can be used) for the duration of  the COVID-19 declaration under Section 564(b)(1) of the Act, 21 U.S.C. section 360-bbb-3(b)(1), unless the authorization is terminated or revoked sooner.  Performed at [facility], [address] [place], [place] [address]     Time coordinating discharge: Over 30 minutes  SIGNED:  Lorella Nimrod, MD  [facility] 03/24/2020, 11:54 AM  If 7PM-7AM, please contact  night-coverage [email or web address]  This record has been created using Systems analyst. Errors have been sought and corrected,but may not always be located. Such creation errors do not reflect on the standard of care.

## 2020-03-24 NOTE — Progress Notes (Signed)
Patient scheduled for a Remdesivir infusion at 2pm on 8/17.  Have patient come to [place], you will see a COVID infusion banner by the road.  Enter there and turn left.  There are marked spaces for Infusion.  Call the number on the sign or [phone] and someone will come out and bring you inside.  If someone is driving you please come to the same area and call the number and someone will come outside to get you. Thank you!

## 2020-03-24 NOTE — Progress Notes (Addendum)
SATURATION QUALIFICATIONS: (This note is used to comply with regulatory documentation for home oxygen)  Patient Saturations on Room Air at Rest = 95%  Patient Saturations on Room Air while Ambulating = 88%  Patient Saturations on 2 Liters of oxygen while Ambulating = 95%  PT  HR increases to 120's while ambulating

## 2020-03-24 NOTE — TOC Initial Note (Signed)
Transition of Care [facility]) - Initial/Assessment Note    Patient Details  Name: Kevin Gregory MRN: [id number] Date of Birth: May 06, 1976  Transition of Care [facility]) CM/SW Contact:    Shelbie Hutching, RN Phone Number: 03/24/2020, 11:58 AM  Clinical Narrative:                 Patient admitted to the hospital with [place].  Patient is from home where he lives with his wife.  Patient works and is independent at home.  MD has medically cleared patient for discharge home today.  Patient will discharge with oxygen.  Patient requires 2L [place].  [employer] will provide oxygen, it will be delivered to the room before the patient leaves.   No other needs identified.  Zach with [employer] reports that the oxygen should be delivered within the next 30 min.   Expected Discharge Plan: Home/Self Care Barriers to Discharge: No Barriers Identified   Patient Goals and CMS Choice        Expected Discharge Plan and Services Expected Discharge Plan: Home/Self Care   Discharge Planning Services: CM Consult   Living arrangements for the past 2 months: Single Family Home Expected Discharge Date: 03/24/20               DME Arranged: Oxygen DME Agency: [employer] Date DME Agency Contacted: 03/24/20 Time DME Agency Contacted: 22 Representative spoke with at DME Agency: [place] Arrangements/Services Living arrangements for the past 2 months: [place] with:: Spouse Patient language and need for interpreter reviewed:: Yes Do you feel safe going back to the place where you live?: Yes      Need for Family Participation in Patient Care: Yes (Comment) (COVID) Care giver support system in place?: Yes (comment) (wife)   Criminal Activity/Legal Involvement Pertinent to Current Situation/Hospitalization: No - Comment as needed  Activities of Daily Living Home Assistive Devices/Equipment: None ADL Screening (condition at time of admission) Patient's cognitive ability  adequate to safely complete daily activities?: Yes Is the patient deaf or have difficulty hearing?: No Does the patient have difficulty seeing, even when wearing glasses/contacts?: No Does the patient have difficulty concentrating, remembering, or making decisions?: No Patient able to express need for assistance with ADLs?: Yes Does the patient have difficulty dressing or bathing?: No Independently performs ADLs?: Yes (appropriate for developmental age) Does the patient have difficulty walking or climbing stairs?: No Weakness of Legs: None Weakness of Arms/Hands: None  Permission Sought/Granted Permission sought to share information with : Case Manager, Chartered certified accountant granted to share information with : Yes, Verbal Permission Granted     Permission granted to share info w AGENCY: [facility]        Emotional Assessment       Orientation: : Oriented to Self, Oriented to Place, Oriented to  Time, Oriented to Situation Alcohol / Substance Use: Not Applicable Psych Involvement: No (comment)  Admission diagnosis:  Acute respiratory failure with hypoxia ([place]) [J96.01] Pneumonia due to COVID-19 virus [U07.1, J12.82] COVID-19 [U07.1] Patient Active Problem List   Diagnosis Date Noted  . Pneumonia due to COVID-19 virus 03/21/2020  . Acute respiratory failure with hypoxia ([place]) 03/21/2020  . Hypokalemia 03/21/2020  . Acute appendicitis 09/30/2019  . Right otitis media 10/05/2018  . Allergic rhinitis 10/05/2018  . Multiple contusions 02/03/2018  . Right lumbar radiculitis 01/12/2017  . Constipation 06/04/2012  . Erectile dysfunction 12/01/2011  . Nephrolithiasis   .  GERD (gastroesophageal reflux disease)   . Preventative health care 11/30/2011   PCP:  Biagio Borg, MD Pharmacy:   [id number] [place], [place] [address] [place] [address] Phone: [phone] Fax: [phone]     Social Determinants of Health  (SDOH) Interventions    Readmission Risk Interventions No flowsheet data found.

## 2020-03-25 ENCOUNTER — Ambulatory Visit (HOSPITAL_COMMUNITY)
Admit: 2020-03-25 | Discharge: 2020-03-25 | Disposition: A | Discharge status: Home / Self Care | Payer: Federal, State, Local not specified - PPO | Source: Ambulatory Visit | Attending: Pulmonary Disease | Admitting: Pulmonary Disease

## 2020-03-25 ENCOUNTER — Telehealth: Payer: Self-pay | Admitting: Internal Medicine

## 2020-03-25 DIAGNOSIS — J1289 Other viral pneumonia: Secondary | ICD-10-CM | POA: Insufficient documentation

## 2020-03-25 DIAGNOSIS — U071 COVID-19: Secondary | ICD-10-CM | POA: Insufficient documentation

## 2020-03-25 MED ORDER — EPINEPHRINE 0.3 MG/0.3ML IJ SOAJ: 0.3000 mg | Freq: Once | INTRAMUSCULAR | Status: DC | PRN

## 2020-03-25 MED ORDER — METHYLPREDNISOLONE SODIUM SUCC 125 MG IJ SOLR: 125.0000 mg | Freq: Once | INTRAMUSCULAR | Status: DC | PRN

## 2020-03-25 MED ORDER — ALBUTEROL SULFATE HFA 108 (90 BASE) MCG/ACT IN AERS: 2.0000 | INHALATION_SPRAY | Freq: Once | RESPIRATORY_TRACT | Status: DC | PRN

## 2020-03-25 MED ORDER — SODIUM CHLORIDE 0.9 % IV SOLN: INTRAVENOUS | Status: DC | PRN

## 2020-03-25 MED ORDER — FAMOTIDINE IN NACL 20-0.9 MG/50ML-% IV SOLN: 20.0000 mg | Freq: Once | INTRAVENOUS | Status: DC | PRN

## 2020-03-25 MED ORDER — DIPHENHYDRAMINE HCL 50 MG/ML IJ SOLN: 50.0000 mg | Freq: Once | INTRAMUSCULAR | Status: DC | PRN

## 2020-03-25 MED ORDER — SODIUM CHLORIDE 0.9 % IV SOLN
100.0000 mg | Freq: Once | INTRAVENOUS | Status: AC
  Administered 2020-03-25: 100 mg via INTRAVENOUS
  Filled 2020-03-25: qty 20

## 2020-03-25 NOTE — Telephone Encounter (Signed)
Very sorry, I have no experience with this medication, and it is still not an accepted first line medication in the use of prevention or treatment of covid

## 2020-03-25 NOTE — Progress Notes (Signed)
  Diagnosis: COVID-19  Physician: Dr. Joya Gaskins  Procedure: [facility] Infusion Clinic Med: remdesivir infusion - Provided patient with remdesivir fact sheet for patients, parents and caregivers prior to infusion.  Complications: No immediate complications noted.    Discharge: Discharged home   Kevin Gregory 03/25/2020

## 2020-03-25 NOTE — Discharge Instructions (Signed)
10 Things You Can Do to Manage Your COVID-19 Symptoms at Home If you have possible or confirmed COVID-19: 1. Stay home from work and school. And stay away from other public places. If you must go out, avoid using any kind of public transportation, ridesharing, or taxis. 2. Monitor your symptoms carefully. If your symptoms get worse, call your healthcare provider immediately. 3. Get rest and stay hydrated. 4. If you have a medical appointment, call the healthcare provider ahead of time and tell them that you have or may have COVID-19. 5. For medical emergencies, call 911 and notify the dispatch personnel that you have or may have COVID-19. 6. Cover your cough and sneezes with a tissue or use the inside of your elbow. 7. Wash your hands often with soap and water for at least 20 seconds or clean your hands with an alcohol-based hand sanitizer that contains at least 60% alcohol. 8. As much as possible, stay in a specific room and away from other people in your home. Also, you should use a separate bathroom, if available. If you need to be around other people in or outside of the home, wear a mask. 9. Avoid sharing personal items with other people in your household, like dishes, towels, and bedding. 10. Clean all surfaces that are touched often, like counters, tabletops, and doorknobs. Use household cleaning sprays or wipes according to the label instructions. [email or web address] 02/07/2019 This information is not intended to replace advice given to you by your health care provider. Make sure you discuss any questions you have with your health care provider. Document Revised: 07/12/2019 Document Reviewed: 07/12/2019 Elsevier Patient Education  [place].  Diagnosis: COVID-19  Physician:Dr. Asencion Noble  Procedure: Covid Infusion Clinic Med: remdesivir infusion - Provided patient with remdesivir fact sheet for patients, parents and caregivers prior to infusion.  Complications: No  immediate complications noted.  Discharge: Discharged home   Kevin Gregory 03/25/2020

## 2020-03-25 NOTE — Telephone Encounter (Signed)
New Message:   Pt is calling and is requesting Ivermectin since he is just now getting out of the hospital from [place]. Pt would like [facility] sent to [id number] [address], [place] Please advise.

## 2020-03-26 NOTE — Telephone Encounter (Signed)
Notified pt w/MD response. He has made f/u ap[pt for 03/28/20.Marland KitchenJohny Gregory

## 2020-03-28 ENCOUNTER — Telehealth: Payer: Federal, State, Local not specified - PPO | Admitting: Internal Medicine

## 2020-03-28 ENCOUNTER — Telehealth: Payer: Self-pay | Admitting: Internal Medicine

## 2020-03-28 NOTE — Telephone Encounter (Signed)
Sent to Dr. John. 

## 2020-03-28 NOTE — Telephone Encounter (Signed)
Sounds good, but I am sure the pt can understand we dont just do this on request, but would need to see him in the office (or if covid infection related he would need to be seen at the post covid clinic) to determine if the oxygen is no longer needed

## 2020-03-28 NOTE — Telephone Encounter (Signed)
New message:   Pt is calling and states that [facility] is calling and stating that they need a order from the pt's PCP before they can come and pick up the oxygen tank that was provided through the hospital when he was there. Please advise.

## 2020-04-04 NOTE — Telephone Encounter (Signed)
LDVM for pt of Dr. Gwynn Burly note and advice on the next steps for the O2 tanks to be picked up.

## 2021-01-22 ENCOUNTER — Other Ambulatory Visit: Payer: Self-pay

## 2021-01-22 ENCOUNTER — Ambulatory Visit (INDEPENDENT_AMBULATORY_CARE_PROVIDER_SITE_OTHER): Payer: Federal, State, Local not specified - PPO | Admitting: Internal Medicine

## 2021-01-22 VITALS — BP 130/80 | HR 80 | Temp 98.1°F | Ht 70.0 in | Wt 243.8 lb

## 2021-01-22 DIAGNOSIS — E876 Hypokalemia: Secondary | ICD-10-CM

## 2021-01-22 DIAGNOSIS — Z0000 Encounter for general adult medical examination without abnormal findings: Secondary | ICD-10-CM

## 2021-01-22 DIAGNOSIS — E559 Vitamin D deficiency, unspecified: Secondary | ICD-10-CM

## 2021-01-22 DIAGNOSIS — N529 Male erectile dysfunction, unspecified: Secondary | ICD-10-CM | POA: Diagnosis not present

## 2021-01-22 DIAGNOSIS — J309 Allergic rhinitis, unspecified: Secondary | ICD-10-CM | POA: Diagnosis not present

## 2021-01-22 DIAGNOSIS — E538 Deficiency of other specified B group vitamins: Secondary | ICD-10-CM | POA: Diagnosis not present

## 2021-01-22 DIAGNOSIS — K219 Gastro-esophageal reflux disease without esophagitis: Secondary | ICD-10-CM

## 2021-01-22 DIAGNOSIS — Z1159 Encounter for screening for other viral diseases: Secondary | ICD-10-CM

## 2021-01-22 MED ORDER — TADALAFIL 20 MG PO TABS: 10.0000 mg | ORAL_TABLET | ORAL | 11 refills | Status: DC | PRN

## 2021-01-22 NOTE — Patient Instructions (Signed)
Please take all new medication as prescribed - the cialis as needed  Please continue all other medications as before, and refills have been done if requested.  Please have the pharmacy call with any other refills you may need.  Please continue your efforts at being more active, low cholesterol diet, and weight control.  Please keep your appointments with your specialists as you may have planned  Please make an Appointment to return in 6 months, or sooner if needed, with labs done a few ahead at the [facility] lab site

## 2021-01-25 ENCOUNTER — Encounter: Payer: Self-pay | Admitting: Internal Medicine

## 2021-01-25 NOTE — Assessment & Plan Note (Signed)
Does not want lab testing today, plans to f/u wit labs at next visit.

## 2021-01-25 NOTE — Assessment & Plan Note (Signed)
Mild to mod, for cialis prn,  to f/u any worsening symptoms or concerns

## 2021-01-25 NOTE — Assessment & Plan Note (Signed)
Mild to mod, for otc allegra and/or nasacort asd, to f/u any worsening symptoms or concerns

## 2021-01-25 NOTE — Progress Notes (Signed)
Patient ID: Kevin Gregory, male   DOB: October 15, 1975, 45 y.o.   MRN: [id number]        Chief Complaint: follow up ED, gerd, allergies,        HPI:  Kevin Gregory is a 45 y.o. male here with c/o 6 mo worsening ED symptoms, Denies worsening depressive symptoms, suicidal ideation, or panic; Pt denies chest pain, increased sob or doe, wheezing, orthopnea, PND, increased LE swelling, palpitations, dizziness or syncope.   Pt denies polydipsia, polyuria, or new focal neuro s/s.  Does have several wks ongoing nasal allergy symptoms with clearish congestion, itch and sneezing, without fever, pain, ST, cough, swelling or wheezing.  Denies worsening reflux, abd pain, dysphagia, n/v, bowel change or blood    Pt denies fever, wt loss, night sweats, loss of appetite, or other constitutional symptoms  No other new complaints.  Does not want lab testing today, plans to f/u wit labs at next visit.       Wt Readings from Last 3 Encounters:  01/22/21 243 lb 12.8 oz (110.6 kg)  03/20/20 215 lb (97.5 kg)  10/17/19 222 lb 3.2 oz (100.8 kg)   BP Readings from Last 3 Encounters:  01/22/21 130/80  03/25/20 115/86  03/24/20 (!) 136/96         Past Medical History:  Diagnosis Date   Erectile dysfunction 12/01/2011   GERD (gastroesophageal reflux disease)    Kidney stones    Nephrolithiasis    Past Surgical History:  Procedure Laterality Date   LAPAROSCOPIC APPENDECTOMY N/A 10/01/2019   Procedure: APPENDECTOMY LAPAROSCOPIC;  Surgeon: Fredirick Maudlin, MD;  Location: [facility] ORS;  Service: General;  Laterality: N/A;    reports that he has quit smoking. He has never used smokeless tobacco. He reports current alcohol use. He reports that he does not use drugs. family history includes Drug abuse in his maternal grandmother. Allergies  Allergen Reactions   Oxycodone Rash   No current outpatient medications on file prior to visit.   No current facility-administered medications on file prior to visit.        ROS:   All others reviewed and negative.  Objective        PE:  BP 130/80 (BP Location: Right Arm, Patient Position: Sitting, Cuff Size: Large)   Pulse 80   Temp 98.1 F (36.7 C) (Oral)   Ht 5\' 10"  (1.778 m)   Wt 243 lb 12.8 oz (110.6 kg)   SpO2 98%   BMI 34.98 kg/m                 Constitutional: Pt appears in NAD               HENT: Head: NCAT.                Right Ear: External ear normal.                 Left Ear: External ear normal.                Eyes: . Pupils are equal, round, and reactive to light. Conjunctivae and EOM are normal               Nose: without d/c or deformity               Neck: Neck supple. Gross normal ROM               Cardiovascular: Normal rate and regular rhythm.  Pulmonary/Chest: Effort normal and breath sounds without rales or wheezing.                Abd:  Soft, NT, ND, + BS, no organomegaly               Neurological: Pt is alert. At baseline orientation, motor grossly intact               Skin: Skin is warm. No rashes, no other new lesions, LE edema - none               Psychiatric: Pt behavior is normal without agitation   Micro: none  Cardiac tracings I have personally interpreted today:  none  Pertinent Radiological findings (summarize): none   Lab Results  Component Value Date   WBC 9.2 03/24/2020   HGB 14.3 03/24/2020   HCT 42.8 03/24/2020   PLT 218 03/24/2020   GLUCOSE 182 (H) 03/24/2020   CHOL 203 (H) 07/02/2015   TRIG 68 03/21/2020   HDL 33.40 (L) 07/02/2015   LDLDIRECT 123.0 07/02/2015   LDLCALC 122 (H) 12/01/2011   ALT 66 (H) 03/24/2020   AST 41 03/24/2020   NA 140 03/24/2020   K 4.3 03/24/2020   CL 104 03/24/2020   CREATININE 1.07 03/24/2020   BUN 22 (H) 03/24/2020   CO2 24 03/24/2020   TSH 1.70 07/02/2015   PSA 0.75 07/02/2015   HGBA1C 5.3 03/22/2020   Assessment/Plan:  Kevin Gregory is a 45 y.o. Unavailable [8] White or Caucasian [1] male with  has a past medical history of Erectile dysfunction  (12/01/2011), GERD (gastroesophageal reflux disease), Kidney stones, and Nephrolithiasis.  GERD (gastroesophageal reflux disease) Mild, overall stable, to continue otc pepcid bid prn  Erectile dysfunction Mild to mod, for cialis prn,  to f/u any worsening symptoms or concerns  Allergic rhinitis Mild to mod, for otc allegra and/or nasacort asd, to f/u any worsening symptoms or concerns  Hypokalemia Does not want lab testing today, plans to f/u wit labs at next visit.  Followup: Return in about 6 months (around 07/24/2021).  Kevin Cower, MD 01/25/2021 4:04 PM [facility]

## 2021-01-25 NOTE — Assessment & Plan Note (Signed)
Mild, overall stable, to continue otc pepcid bid prn

## 2021-03-22 IMAGING — CT CT ABD-PELV W/ CM
2 of 5 series · 16 of 46 positions shown, 18 images · IV contrast (APPLIED)
Comparison: None.

CLINICAL DATA: 44-year-old male with right lower quadrant abdominal
pain.

EXAM:
CT ABDOMEN AND PELVIS WITH CONTRAST
TECHNIQUE: Multidetector CT imaging of the abdomen and pelvis was performed
using the standard protocol following bolus administration of
intravenous contrast.
CONTRAST:  150mL OMNIPAQUE IOHEXOL 300 MG/ML  SOLN

[Series 2: routine abd/pel with · axial · 0.80mm/px · z∈[-1131,-706]mm · 13 of 97 slices shown, 15 images]
[im 6/97  soft-tissue]
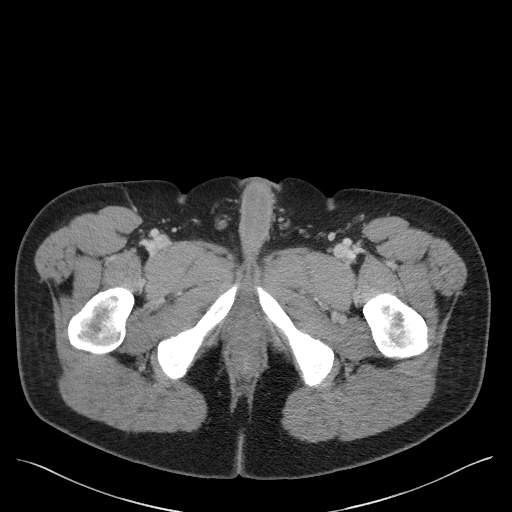
[im 6/97  bone]
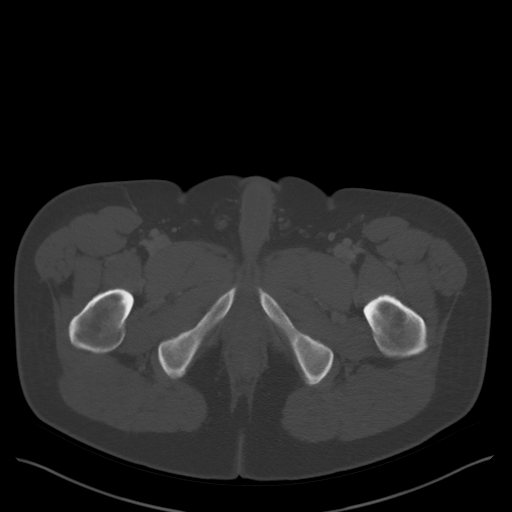
[im 16/97  soft-tissue]
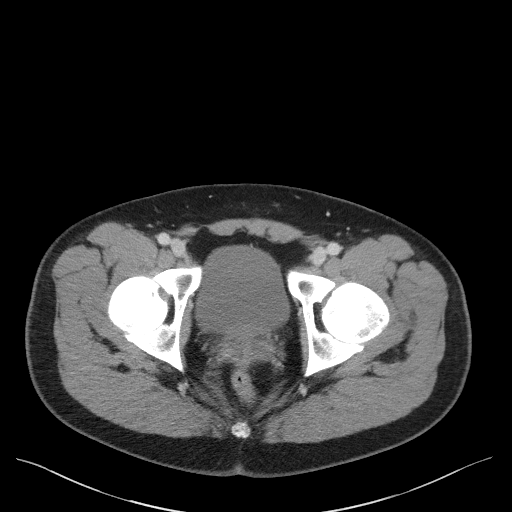
[im 21/97  soft-tissue]
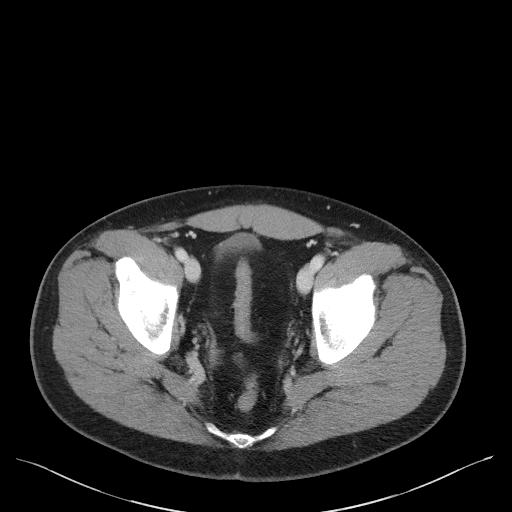
[im 26/97  soft-tissue]
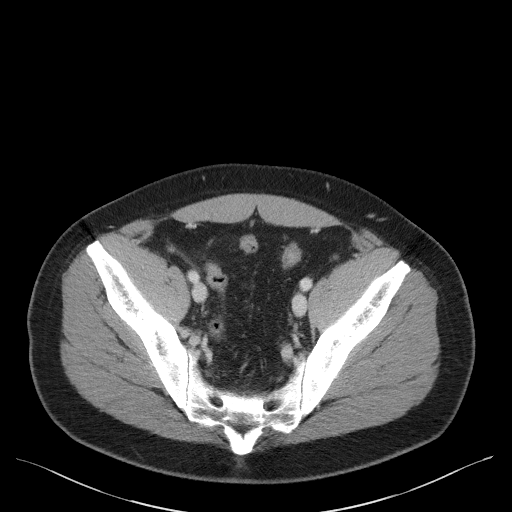
[im 36/97  soft-tissue]
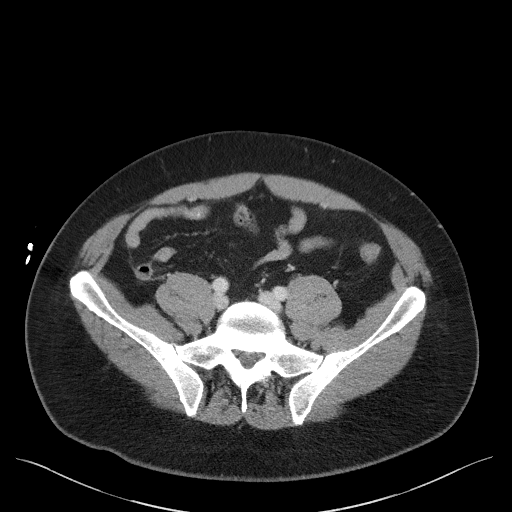
[im 41/97  soft-tissue]
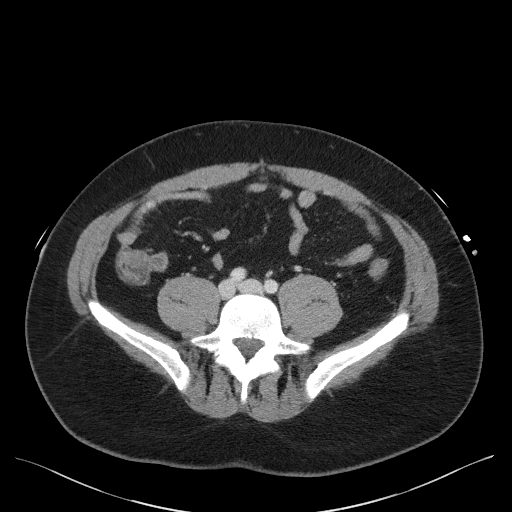
[im 51/97  soft-tissue]
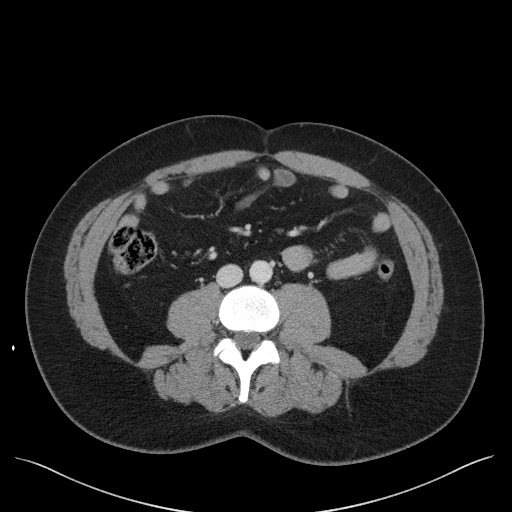
[im 56/97  soft-tissue]
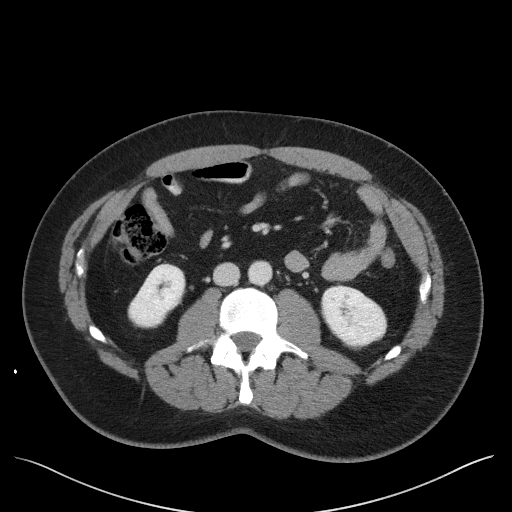
[im 61/97  soft-tissue]
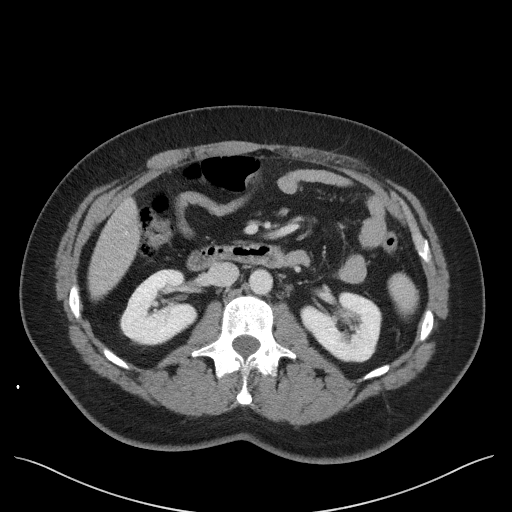
[im 61/97  bone]
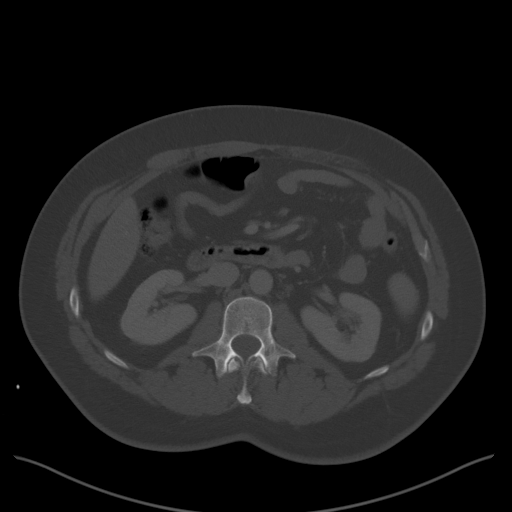
[im 71/97  soft-tissue]
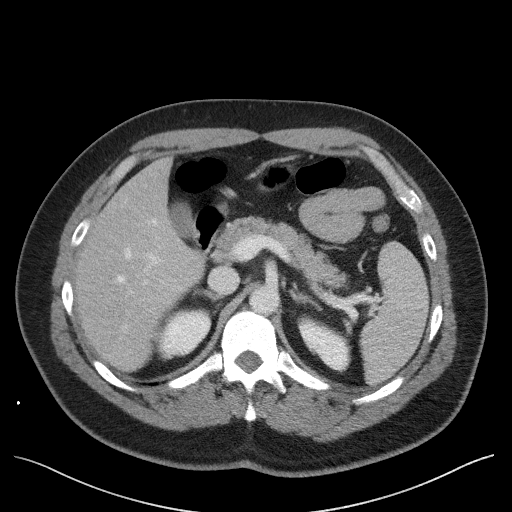
[im 76/97  soft-tissue]
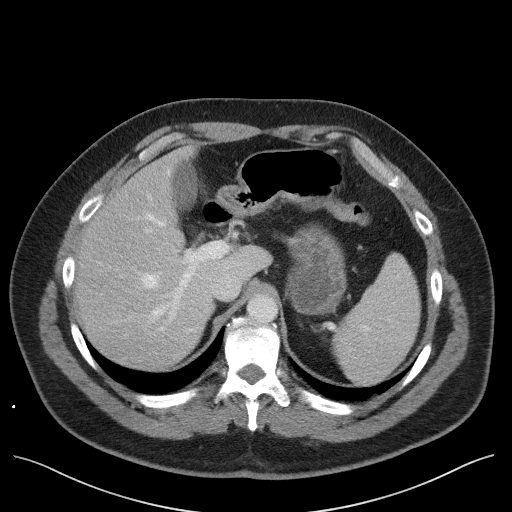
[im 81/97  soft-tissue]
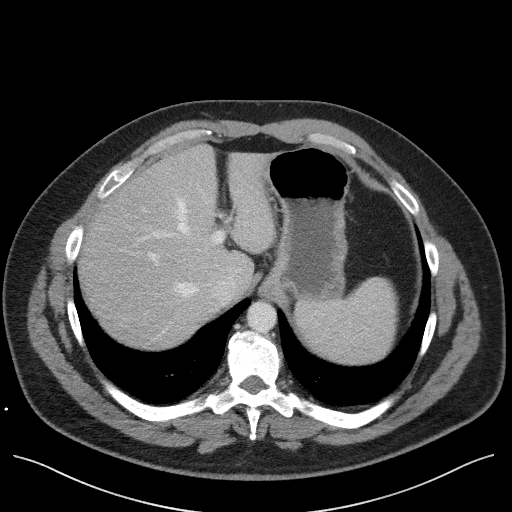
[im 91/97  soft-tissue]
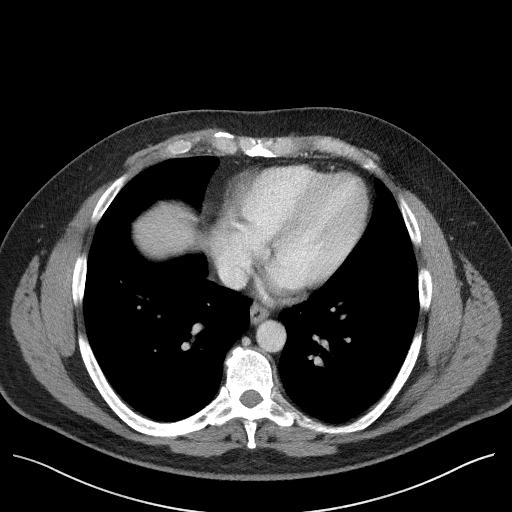

[Series 5: coronal st · coronal · 0.71mm/px · 3 of 102 slices shown]
[im 34/102  soft-tissue]
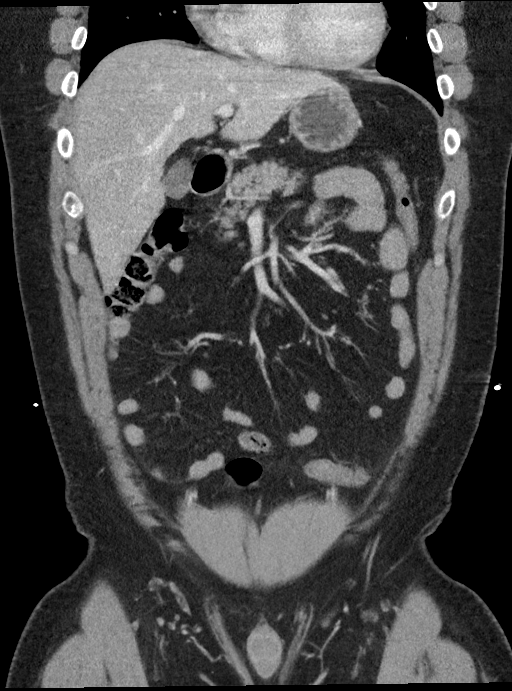
[im 45/102  soft-tissue]
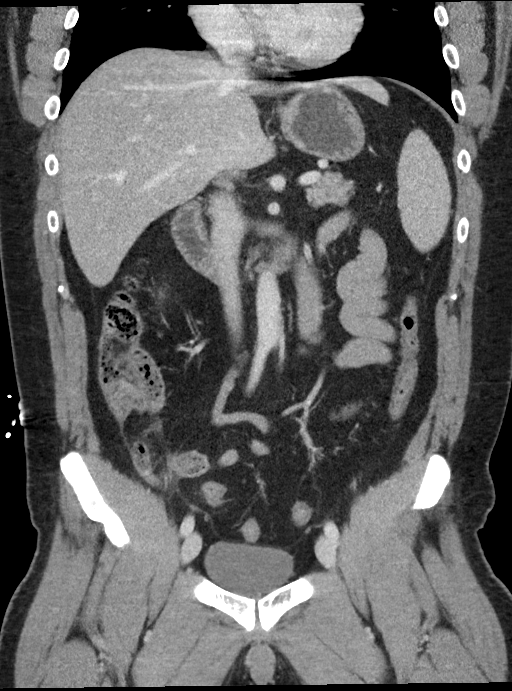
[im 57/102  soft-tissue]
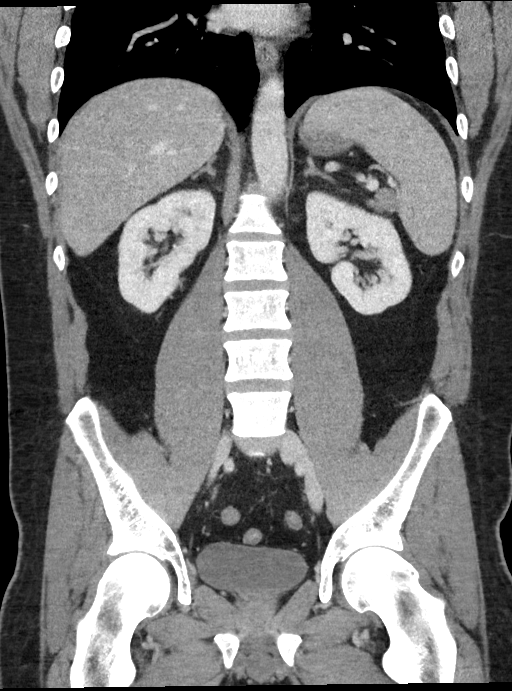

[16 of 46 positions shown; findings below may reference images not displayed]

FINDINGS: Lower chest: The visualized lung bases are clear.

No intra-abdominal free air or free fluid.

Hepatobiliary: Background of fatty infiltration of the liver. No
intrahepatic biliary ductal dilatation. The gallbladder is
unremarkable.

Pancreas: Unremarkable. No pancreatic ductal dilatation or
surrounding inflammatory changes.

Spleen: Normal in size without focal abnormality.

Adrenals/Urinary Tract: The adrenal glands are unremarkable. There
is no hydronephrosis on either side. There is symmetric enhancement
and excretion of contrast by both kidneys. The visualized ureters
and urinary bladder appear unremarkable.

Stomach/Bowel: There is no bowel obstruction. The appendix is
enlarged and inflamed measuring approximately 13 mm in diameter.
Faint attenuating content at the base of the appendix may represent
small appendicoliths. The appendix is located in the right lower
quadrant inferior to the cecum extending inferiorly into the right
hemipelvis. There is no drainable fluid collection or abscess.

Vascular/Lymphatic: The abdominal aorta and IVC are unremarkable. No
portal venous gas. There is no adenopathy.

Reproductive: The prostate and seminal vesicles are grossly
unremarkable. No pelvic mass.

Other: None

Musculoskeletal: No acute or significant osseous findings.
IMPRESSION: 1. Acute appendicitis.  No abscess or perforation.
2. Fatty liver.

## 2021-05-08 ENCOUNTER — Encounter: Payer: Self-pay | Admitting: General Surgery

## 2022-01-20 DIAGNOSIS — R0789 Other chest pain: Secondary | ICD-10-CM | POA: Diagnosis not present

## 2022-01-20 DIAGNOSIS — S2232XA Fracture of one rib, left side, initial encounter for closed fracture: Secondary | ICD-10-CM | POA: Diagnosis not present

## 2022-01-29 ENCOUNTER — Other Ambulatory Visit: Payer: Self-pay | Admitting: Internal Medicine

## 2022-04-25 ENCOUNTER — Emergency Department: Payer: Federal, State, Local not specified - PPO

## 2022-04-25 ENCOUNTER — Other Ambulatory Visit: Payer: Self-pay

## 2022-04-25 DIAGNOSIS — B349 Viral infection, unspecified: Secondary | ICD-10-CM | POA: Diagnosis not present

## 2022-04-25 DIAGNOSIS — K5900 Constipation, unspecified: Secondary | ICD-10-CM | POA: Diagnosis not present

## 2022-04-25 DIAGNOSIS — Z20822 Contact with and (suspected) exposure to covid-19: Secondary | ICD-10-CM | POA: Insufficient documentation

## 2022-04-25 DIAGNOSIS — R079 Chest pain, unspecified: Secondary | ICD-10-CM | POA: Diagnosis not present

## 2022-04-25 DIAGNOSIS — Z8616 Personal history of COVID-19: Secondary | ICD-10-CM | POA: Diagnosis not present

## 2022-04-25 DIAGNOSIS — R0602 Shortness of breath: Secondary | ICD-10-CM | POA: Diagnosis not present

## 2022-04-25 DIAGNOSIS — R0789 Other chest pain: Secondary | ICD-10-CM | POA: Diagnosis not present

## 2022-04-25 LAB — CBC
HCT: 47.3 % (ref 39.0–52.0)
Hemoglobin: 15.3 g/dL (ref 13.0–17.0)
MCH: 28.3 pg (ref 26.0–34.0)
MCHC: 32.3 g/dL (ref 30.0–36.0)
MCV: 87.6 fL (ref 80.0–100.0)
Platelets: 132 10*3/uL — ABNORMAL LOW (ref 150–400)
RBC: 5.4 MIL/uL (ref 4.22–5.81)
RDW: 12 % (ref 11.5–15.5)
WBC: 7 10*3/uL (ref 4.0–10.5)
nRBC: 0 % (ref 0.0–0.2)

## 2022-04-25 LAB — BASIC METABOLIC PANEL
Anion gap: 11 (ref 5–15)
BUN: 18 mg/dL (ref 6–20)
CO2: 26 mmol/L (ref 22–32)
Calcium: 9.3 mg/dL (ref 8.9–10.3)
Chloride: 103 mmol/L (ref 98–111)
Creatinine, Ser: 1.11 mg/dL (ref 0.61–1.24)
GFR, Estimated: >60 mL/min (ref >60)
Glucose, Bld: 153 mg/dL — ABNORMAL HIGH (ref 70–99)
Potassium: 4.2 mmol/L (ref 3.5–5.1)
Sodium: 140 mmol/L (ref 135–145)

## 2022-04-25 LAB — TROPONIN I (HIGH SENSITIVITY): Troponin I (High Sensitivity): 3 ng/L (ref <18)

## 2022-04-25 NOTE — ED Triage Notes (Signed)
Pt with chest pain for approx 6-7 weeks. Pt states over last week pain has been getting worse. Pt appears in no acute distress. Pt states he is also having shob.

## 2022-04-26 ENCOUNTER — Emergency Department
Admission: EM | Admit: 2022-04-26 | Discharge: 2022-04-26 | Disposition: A | Discharge status: Home / Self Care | Payer: Federal, State, Local not specified - PPO | Attending: Emergency Medicine | Admitting: Emergency Medicine

## 2022-04-26 DIAGNOSIS — R079 Chest pain, unspecified: Secondary | ICD-10-CM

## 2022-04-26 LAB — TROPONIN I (HIGH SENSITIVITY): Troponin I (High Sensitivity): 3 ng/L (ref <18)

## 2022-04-26 NOTE — ED Provider Notes (Signed)
[facility] Provider Note    Event Date/Time   First MD Initiated Contact with Patient 04/26/22 [phone]     (approximate)   History   Chest Pain   HPI  Kevin Gregory is a 46 y.o. male past medical history of GERD, kidney stones who presents with chest pain.  Patient has had pain in the left side of the chest going on for about 2 months.  Started after he had a fall onto the left side went to an outside facility and was told he had a rib fracture.  It is primarily in the left-sided low chest occasionally on the right side and occasionally in the right back.  Feels worse with inspiration.  Occasionally he feels short of breath.  He sometimes runs for cardio does not feel it with running.  He also lifts weights and has been feeling it with weight lifting.  Denies cough.  No fevers or chills.  He was on a plane from [facility] earlier today.  When he got off the plane he was carrying a suitcase up the stairs and this made the pain hurt worse and he felt short of breath.  This is why he specifically comes in tonight.  He has not seen a cardiologist before.  No history of coronary disease in young people in his family.  Denies drug or alcohol use.     Past Medical History:  Diagnosis Date   Erectile dysfunction 12/01/2011   GERD (gastroesophageal reflux disease)    Kidney stones    Nephrolithiasis     Patient Active Problem List   Diagnosis Date Noted   Pneumonia due to COVID-19 virus 03/21/2020   Acute respiratory failure with hypoxia ([facility]) 03/21/2020   Hypokalemia 03/21/2020   Acute appendicitis 09/30/2019   Right otitis media 10/05/2018   Allergic rhinitis 10/05/2018   Multiple contusions 02/03/2018   Right lumbar radiculitis 01/12/2017   Constipation 06/04/2012   Erectile dysfunction 12/01/2011   Nephrolithiasis    GERD (gastroesophageal reflux disease)    Preventative health care 11/30/2011     Physical Exam  Triage Vital Signs: ED Triage  Vitals  Enc Vitals Group     BP 04/25/22 2249 (!) 140/95     Pulse Rate 04/25/22 2249 81     Resp 04/25/22 2249 16     Temp 04/25/22 2249 98 F (36.7 C)     Temp Source 04/25/22 2249 Oral     SpO2 04/25/22 2249 96 %     Weight 04/25/22 2247 212 lb (96.2 kg)     Height 04/25/22 2247 '5\' 10"'$  (1.778 m)     Head Circumference --      Peak Flow --      Pain Score 04/25/22 2247 5     Pain Loc --      Pain Edu? --      Excl. in [place]? --     Most recent vital signs: Vitals:   04/25/22 2249 04/26/22 0118  BP: (!) 140/95 (!) 134/94  Pulse: 81 67  Resp: 16 16  Temp: 98 F (36.7 C)   SpO2: 96% 98%     General: Awake, no distress.  CV:  Good peripheral perfusion.  Resp:  Normal effort.  Lungs are clear Abd:  No distention.  Neuro:             Awake, Alert, Oriented x 3  Other:  No skin changes over the chest no anterior chest wall tenderness  No peripheral edema or asymmetry of the legs   ED Results / Procedures / Treatments  Labs (all labs ordered are listed, but only abnormal results are displayed) Labs Reviewed  BASIC METABOLIC PANEL - Abnormal; Notable for the following components:      Result Value   Glucose, Bld 153 (*)    All other components within normal limits  CBC - Abnormal; Notable for the following components:   Platelets 132 (*)    All other components within normal limits  TROPONIN I (HIGH SENSITIVITY)  TROPONIN I (HIGH SENSITIVITY)     EKG  EKG reviewed interpreted myself shows normal sinus rhythm normal axis normal intervals inverted T wave in lead III similar to prior   RADIOLOGY I reviewed and interpreted the CXR which does not show any acute cardiopulmonary process    PROCEDURES:  Critical Care performed: No  Procedures  The patient is on the cardiac monitor to evaluate for evidence of arrhythmia and/or significant heart rate changes.   MEDICATIONS ORDERED IN ED: Medications - No data to display   IMPRESSION / MDM / [place] / ED COURSE  I reviewed the triage vital signs and the nursing notes.                              Patient's presentation is most consistent with acute presentation with potential threat to life or bodily function.  Differential diagnosis includes, but is not limited to, acute coronary syndrome, pulmonary embolism, pleurisy, musculoskeletal pain, GERD  -year-old male with no cardiac risk factors presents with several months of chest pain.  His pain is atypical and that it is sharp it is worse with inspiration and is not clearly exertional.  Does sometimes feel it when he is lifting weights.  He has some associated shortness of breath but no nausea vomiting or diaphoresis.  Pain seemed to get worse today after he carried his suitcase up the stairs.  He does not have pain when I am evaluating him now.  His EKG is nonischemic.  He has an inverted T wave in lead III but this is similar to prior.  Troponins x2 are negative.  Overall my suspicion for acute coronary syndrome is low.  His heart score is 1.  Also consider pulmonary embolism given the pleuritic nature of the pain however with the chronicity and the fact that he is PERC negative I feel this is less likely.  Because pain has persisted for several months I will refer to cardiology. Because of the above I feel that this work-up can be done as an outpatient.       FINAL CLINICAL IMPRESSION(S) / ED DIAGNOSES   Final diagnoses:  Nonspecific chest pain     Rx / DC Orders   ED Discharge Orders          Ordered    Ambulatory referral to Cardiology       Comments: If you have not heard from the Cardiology office within the next 72 hours please call [phone].   04/26/22 0418             Note:  This document was prepared using Dragon voice recognition software and may include unintentional dictation errors.   Rada Hay, MD 04/26/22 [phone]

## 2022-04-26 NOTE — Discharge Instructions (Addendum)
Your EKG and cardiac enzymes were reassuring today.  You can take Tylenol Motrin for your pain.  Please follow-up with cardiology.  I have placed a referral you should hear from the office within the next day or 2.  If you do not hear from them please call the number above to schedule an appointment with Dr. Saralyn Pilar.  If you have pain that is worsening or persisting please return to emergency department.

## 2022-04-29 ENCOUNTER — Other Ambulatory Visit: Payer: Self-pay | Admitting: Cardiology

## 2022-04-29 DIAGNOSIS — R0602 Shortness of breath: Secondary | ICD-10-CM | POA: Diagnosis not present

## 2022-04-29 DIAGNOSIS — I82492 Acute embolism and thrombosis of other specified deep vein of left lower extremity: Secondary | ICD-10-CM | POA: Diagnosis not present

## 2022-04-29 DIAGNOSIS — M79605 Pain in left leg: Secondary | ICD-10-CM

## 2022-04-29 DIAGNOSIS — R079 Chest pain, unspecified: Secondary | ICD-10-CM | POA: Diagnosis not present

## 2022-04-30 ENCOUNTER — Other Ambulatory Visit: Payer: Self-pay | Admitting: Family Medicine

## 2022-04-30 ENCOUNTER — Ambulatory Visit
Admission: RE | Admit: 2022-04-30 | Discharge: 2022-04-30 | Disposition: A | Discharge status: Home / Self Care | Payer: Federal, State, Local not specified - PPO | Source: Ambulatory Visit | Attending: Family Medicine | Admitting: Family Medicine

## 2022-04-30 ENCOUNTER — Ambulatory Visit
Admission: RE | Admit: 2022-04-30 | Discharge: 2022-04-30 | Disposition: A | Discharge status: Home / Self Care | Payer: Federal, State, Local not specified - PPO | Source: Ambulatory Visit | Attending: Cardiology | Admitting: Cardiology

## 2022-04-30 DIAGNOSIS — I82492 Acute embolism and thrombosis of other specified deep vein of left lower extremity: Secondary | ICD-10-CM | POA: Diagnosis not present

## 2022-04-30 DIAGNOSIS — R071 Chest pain on breathing: Secondary | ICD-10-CM | POA: Insufficient documentation

## 2022-04-30 DIAGNOSIS — I82442 Acute embolism and thrombosis of left tibial vein: Secondary | ICD-10-CM

## 2022-04-30 DIAGNOSIS — M79605 Pain in left leg: Secondary | ICD-10-CM | POA: Diagnosis not present

## 2022-04-30 DIAGNOSIS — I2699 Other pulmonary embolism without acute cor pulmonale: Secondary | ICD-10-CM | POA: Diagnosis not present

## 2022-04-30 MED ORDER — IOHEXOL 350 MG/ML SOLN
75.0000 mL | Freq: Once | INTRAVENOUS | Status: AC | PRN
  Administered 2022-04-30: 75 mL via INTRAVENOUS

## 2022-05-04 DIAGNOSIS — Z111 Encounter for screening for respiratory tuberculosis: Secondary | ICD-10-CM | POA: Diagnosis not present

## 2022-05-04 DIAGNOSIS — R0602 Shortness of breath: Secondary | ICD-10-CM | POA: Diagnosis not present

## 2022-05-04 DIAGNOSIS — Z114 Encounter for screening for human immunodeficiency virus [HIV]: Secondary | ICD-10-CM | POA: Diagnosis not present

## 2022-05-04 DIAGNOSIS — R071 Chest pain on breathing: Secondary | ICD-10-CM | POA: Diagnosis not present

## 2022-05-04 DIAGNOSIS — K769 Liver disease, unspecified: Secondary | ICD-10-CM | POA: Diagnosis not present

## 2022-05-04 DIAGNOSIS — I82442 Acute embolism and thrombosis of left tibial vein: Secondary | ICD-10-CM | POA: Diagnosis not present

## 2022-05-04 DIAGNOSIS — R079 Chest pain, unspecified: Secondary | ICD-10-CM | POA: Diagnosis not present

## 2022-05-04 DIAGNOSIS — M79605 Pain in left leg: Secondary | ICD-10-CM | POA: Diagnosis not present

## 2022-05-04 DIAGNOSIS — R972 Elevated prostate specific antigen [PSA]: Secondary | ICD-10-CM | POA: Diagnosis not present

## 2022-05-04 DIAGNOSIS — I2693 Single subsegmental pulmonary embolism without acute cor pulmonale: Secondary | ICD-10-CM | POA: Diagnosis not present

## 2022-05-05 ENCOUNTER — Encounter: Payer: Self-pay | Admitting: Infectious Diseases

## 2022-05-05 ENCOUNTER — Other Ambulatory Visit: Payer: Self-pay | Admitting: Infectious Diseases

## 2022-05-05 ENCOUNTER — Other Ambulatory Visit (HOSPITAL_COMMUNITY): Payer: Self-pay | Admitting: Infectious Diseases

## 2022-05-05 DIAGNOSIS — K769 Liver disease, unspecified: Secondary | ICD-10-CM

## 2022-05-05 DIAGNOSIS — R071 Chest pain on breathing: Secondary | ICD-10-CM

## 2022-05-05 DIAGNOSIS — K7689 Other specified diseases of liver: Secondary | ICD-10-CM

## 2022-05-05 DIAGNOSIS — D7389 Other diseases of spleen: Secondary | ICD-10-CM

## 2022-05-06 ENCOUNTER — Ambulatory Visit
Admission: RE | Admit: 2022-05-06 | Discharge: 2022-05-06 | Disposition: A | Discharge status: Home / Self Care | Payer: Federal, State, Local not specified - PPO | Source: Ambulatory Visit | Attending: Infectious Diseases | Admitting: Infectious Diseases

## 2022-05-06 DIAGNOSIS — K7689 Other specified diseases of liver: Secondary | ICD-10-CM | POA: Diagnosis not present

## 2022-05-06 DIAGNOSIS — R071 Chest pain on breathing: Secondary | ICD-10-CM | POA: Diagnosis not present

## 2022-05-06 DIAGNOSIS — D7389 Other diseases of spleen: Secondary | ICD-10-CM | POA: Insufficient documentation

## 2022-05-06 DIAGNOSIS — K769 Liver disease, unspecified: Secondary | ICD-10-CM | POA: Diagnosis not present

## 2022-05-06 MED ORDER — IOHEXOL 300 MG/ML  SOLN
100.0000 mL | Freq: Once | INTRAMUSCULAR | Status: AC | PRN
  Administered 2022-05-06: 100 mL via INTRAVENOUS

## 2022-05-07 ENCOUNTER — Inpatient Hospital Stay: Payer: Federal, State, Local not specified - PPO

## 2022-05-07 ENCOUNTER — Telehealth: Payer: Self-pay | Admitting: *Deleted

## 2022-05-07 ENCOUNTER — Encounter: Payer: Self-pay | Admitting: Internal Medicine

## 2022-05-07 ENCOUNTER — Inpatient Hospital Stay: Payer: Federal, State, Local not specified - PPO | Attending: Internal Medicine | Admitting: Internal Medicine

## 2022-05-07 DIAGNOSIS — R161 Splenomegaly, not elsewhere classified: Secondary | ICD-10-CM | POA: Insufficient documentation

## 2022-05-07 DIAGNOSIS — R911 Solitary pulmonary nodule: Secondary | ICD-10-CM | POA: Insufficient documentation

## 2022-05-07 DIAGNOSIS — K769 Liver disease, unspecified: Secondary | ICD-10-CM | POA: Diagnosis not present

## 2022-05-07 DIAGNOSIS — Z7901 Long term (current) use of anticoagulants: Secondary | ICD-10-CM | POA: Insufficient documentation

## 2022-05-07 DIAGNOSIS — I2699 Other pulmonary embolism without acute cor pulmonale: Secondary | ICD-10-CM | POA: Insufficient documentation

## 2022-05-07 DIAGNOSIS — K869 Disease of pancreas, unspecified: Secondary | ICD-10-CM | POA: Diagnosis not present

## 2022-05-07 DIAGNOSIS — I82442 Acute embolism and thrombosis of left tibial vein: Secondary | ICD-10-CM | POA: Diagnosis not present

## 2022-05-07 DIAGNOSIS — K8689 Other specified diseases of pancreas: Secondary | ICD-10-CM

## 2022-05-07 LAB — COMPREHENSIVE METABOLIC PANEL
ALT: 21 U/L (ref 0–44)
AST: 24 U/L (ref 15–41)
Albumin: 4.8 g/dL (ref 3.5–5.0)
Alkaline Phosphatase: 122 U/L (ref 38–126)
Anion gap: 6 (ref 5–15)
BUN: 12 mg/dL (ref 6–20)
CO2: 26 mmol/L (ref 22–32)
Calcium: 9.1 mg/dL (ref 8.9–10.3)
Chloride: 104 mmol/L (ref 98–111)
Creatinine, Ser: 1.01 mg/dL (ref 0.61–1.24)
GFR, Estimated: >60 mL/min (ref >60)
Glucose, Bld: 123 mg/dL — ABNORMAL HIGH (ref 70–99)
Potassium: 3.8 mmol/L (ref 3.5–5.1)
Sodium: 136 mmol/L (ref 135–145)
Total Bilirubin: 1.2 mg/dL (ref 0.3–1.2)
Total Protein: 8.2 g/dL — ABNORMAL HIGH (ref 6.5–8.1)

## 2022-05-07 LAB — CBC WITH DIFFERENTIAL/PLATELET
Abs Immature Granulocytes: 0.02 10*3/uL (ref 0.00–0.07)
Basophils Absolute: 0 10*3/uL (ref 0.0–0.1)
Basophils Relative: 0 %
Eosinophils Absolute: 0.2 10*3/uL (ref 0.0–0.5)
Eosinophils Relative: 2 %
HCT: 47.1 % (ref 39.0–52.0)
Hemoglobin: 16.4 g/dL (ref 13.0–17.0)
Immature Granulocytes: 0 %
Lymphocytes Relative: 10 %
Lymphs Abs: 0.8 10*3/uL (ref 0.7–4.0)
MCH: 29 pg (ref 26.0–34.0)
MCHC: 34.8 g/dL (ref 30.0–36.0)
MCV: 83.4 fL (ref 80.0–100.0)
Monocytes Absolute: 0.5 10*3/uL (ref 0.1–1.0)
Monocytes Relative: 6 %
Neutro Abs: 6.5 10*3/uL (ref 1.7–7.7)
Neutrophils Relative %: 82 %
Platelets: 152 10*3/uL (ref 150–400)
RBC: 5.65 MIL/uL (ref 4.22–5.81)
RDW: 11.8 % (ref 11.5–15.5)
WBC: 8.1 10*3/uL (ref 4.0–10.5)
nRBC: 0 % (ref 0.0–0.2)

## 2022-05-07 NOTE — Telephone Encounter (Signed)
Patient called with update regarding biopsy. Dr. B spoke with Dr. Denna Haggard regarding patient and is recommended to biopsy subcutaneous nodule first. Patient will not have to hold Eliquis for procedure. Order placed by Dr. B and awaiting scheduling details at this time. Will call with update when available. Patient verbalized understanding of plan.

## 2022-05-07 NOTE — Progress Notes (Signed)
Patient on schedule for unsedated/per patient request subcutaneous tissue biopsy, spoke with patient on phone , to arrive at 1230 at [facility]. Stated understanding.

## 2022-05-07 NOTE — Telephone Encounter (Signed)
Call placed to patient with appointment for biopsy on 10/4 at 1:00 pm, patient to arrive at 12:30. Patient is agreeable and verbalized understanding of plan.

## 2022-05-07 NOTE — Progress Notes (Signed)
[name] NOTE  Patient Care Team: Biagio Borg, MD as PCP - General (Internal Medicine) [name], RN as Oncology Nurse Navigator  CHIEF COMPLAINTS/PURPOSE OF CONSULTATION: PE/ splenic and liver lesions  # IMPRESSION: 1. Single right lower lobe segmental pulmonary embolism. No evidence for right heart strain. 2. 3 mm ground-glass pulmonary nodule, new from prior. Per Fleischner Society Guidelines, no routine follow-up imaging is recommended. these guidelines do not apply to immunocompromised patients and patients with cancer. Follow up in patients with significant comorbidities as clinically warranted. For lung cancer screening, adhere to Lung-RADS guidelines. Reference: Radiology. 2017; 284(1):228-43. 3. New splenomegaly with new hypodense splenic lesions measuring up to 7.5 cm. Malignancy not excluded. 4. New multiple hypodense hepatic lesions concerning for metastatic disease. Recommend clinical correlation and follow-up.     Electronically Signed   By: Ronney Asters M.D.   On: 04/30/2022 17:50  IMPRESSION: 1. Large mass centered at the pancreatic tail and splenic hilum, favored to be primary pancreatic malignancy with direct splenic invasion. Recommend tissue sampling. 2. Numerous liver and splenic lesions, highly concerning for metastatic disease. 3. Peritoneal thickening and nodularity of the left upper quadrant and left lower quadrant soft tissue nodule, highly concerning for peritoneal carcinomatosis. 4. Subcutaneous soft tissue nodule of the left lower back, likely due to injection granuloma, metastatic disease less likely.     Electronically Signed   By: Yetta Glassman M.D.   On: 05/06/2022 16:24  Oncology History   No history exists.     HISTORY OF PRESENTING ILLNESS:   Kevin Gregory 46 y.o.  male with recent diagnosis of right PE-has been referred to [place] for further evaluation recommendations for incidental liver  lesions/splenic lesions noted on CT chest.  Patient recently traveled from [facility] on a flight.  Noted to have left-sided chest pain which led to further evaluation in the emergency room.  Patient also noted to have calf pain on the left side. SEP 19th, 2023- Left lower extremity venous Doppler ultrasound was performed on 04/30/2022 which revealed isolated calf vein DVT in the anterior tibial veins and superficial thrombophlebitis of branch veins in the popliteal fossa. Chest CT was performed which revealed a single right lower lobe segmental pulmonary embolism without evidence for right heart strain. In addition, 7.5 cm hypodense splenic lesions and multiple hypodense hepatic lesions were observed.   Patient s/p cardiology evaluation.  Also evaluated PCP for his splenic and liver lesions.   Mild weight loss which patient attributes to eating healthy.  But no unintentional weight loss.  Review of Systems  Constitutional:  Positive for weight loss. Negative for chills, diaphoresis, fever and malaise/fatigue.  HENT:  Negative for nosebleeds and sore throat.   Eyes:  Negative for double vision.  Respiratory:  Negative for cough, hemoptysis, sputum production, shortness of breath and wheezing.   Cardiovascular:  Negative for chest pain, palpitations, orthopnea and leg swelling.  Gastrointestinal:  Negative for abdominal pain, blood in stool, constipation, diarrhea, heartburn, melena, nausea and vomiting.  Genitourinary:  Negative for dysuria, frequency and urgency.  Musculoskeletal:  Negative for back pain and joint pain.  Skin: Negative.  Negative for itching and rash.  Neurological:  Negative for dizziness, tingling, focal weakness, weakness and headaches.  Endo/Heme/Allergies:  Does not bruise/bleed easily.  Psychiatric/Behavioral:  Negative for depression. The patient is not nervous/anxious and does not have insomnia.     MEDICAL HISTORY:  Past Medical History:  Diagnosis Date    Erectile  dysfunction 12/01/2011   GERD (gastroesophageal reflux disease)    Kidney stones    Nephrolithiasis     SURGICAL HISTORY: Past Surgical History:  Procedure Laterality Date   LAPAROSCOPIC APPENDECTOMY N/A 10/01/2019   Procedure: APPENDECTOMY LAPAROSCOPIC;  Surgeon: Fredirick Maudlin, MD;  Location: [facility] ORS;  Service: General;  Laterality: N/A;    SOCIAL HISTORY: Social History   Socioeconomic History   Marital status: Widowed    Spouse name: Not on file   Number of children: Not on file   Years of education: 12   Highest education level: Not on file  Occupational History   Occupation: Designer, industrial/product  Tobacco Use   Smoking status: Former    Types: Cigarettes    Quit date: 05/07/2013    Years since quitting: 9.0   Smokeless tobacco: Never  Substance and Sexual Activity   Alcohol use: Yes    Comment: rare   Drug use: No   Sexual activity: Yes  Other Topics Concern   Not on file  Social History Web designer. Non-smoker- quit 10 years;Alcohol- none; NO drugs. Lives in [place]; with wife; and son 23y-autistic.    Social Determinants of Health   Financial Resource Strain: Not on file  Food Insecurity: Not on file  Transportation Needs: No Transportation Needs (05/07/2022)   PRAPARE - Hydrologist (Medical): No    Lack of Transportation (Non-Medical): No  Physical Activity: Not on file  Stress: Not on file  Social Connections: Not on file  Intimate Partner Violence: Not on file    FAMILY HISTORY: Family History  Problem Relation Age of Onset   Leukemia Maternal Uncle    Drug abuse Maternal Grandmother     ALLERGIES:  is allergic to oxycodone.  MEDICATIONS:  Current Outpatient Medications  Medication Sig Dispense Refill   ELIQUIS 5 MG TABS tablet Take by mouth.     tadalafil (CIALIS) 20 MG tablet TAKE 0.5-1 TABLETS (10-20 MG TOTAL) BY MOUTH EVERY OTHER DAY AS NEEDED FOR ERECTILE DYSFUNCTION. 10  tablet 1   No current facility-administered medications for this visit.    PHYSICAL EXAMINATION:   Vitals:   05/07/22 0800  BP: 139/83  Pulse: 93  Resp: 18  Temp: 98.8 F (37.1 C)   Filed Weights   05/07/22 0800  Weight: 209 lb 3.2 oz (94.9 kg)   Approximately 2 cm nodule noted in the left posterior back.  Nontender.  Physical Exam Vitals and nursing note reviewed.  HENT:     Head: Normocephalic and atraumatic.     Mouth/Throat:     Pharynx: Oropharynx is clear.  Eyes:     Extraocular Movements: Extraocular movements intact.     Pupils: Pupils are equal, round, and reactive to light.  Cardiovascular:     Rate and Rhythm: Normal rate and regular rhythm.  Pulmonary:     Comments: Decreased breath sounds bilaterally.  Abdominal:     Palpations: Abdomen is soft.  Musculoskeletal:        General: Normal range of motion.     Cervical back: Normal range of motion.  Skin:    General: Skin is warm.  Neurological:     General: No focal deficit present.     Mental Status: He is alert and oriented to person, place, and time.  Psychiatric:        Behavior: Behavior normal.        Judgment: Judgment normal.     LABORATORY DATA:  I have reviewed the data as listed Lab Results  Component Value Date   WBC 8.1 05/07/2022   HGB 16.4 05/07/2022   HCT 47.1 05/07/2022   MCV 83.4 05/07/2022   PLT 152 05/07/2022   Recent Labs    04/25/22 2245 05/07/22 0952  NA 140 136  K 4.2 3.8  CL 103 104  CO2 26 26  GLUCOSE 153* 123*  BUN 18 12  CREATININE 1.11 1.01  CALCIUM 9.3 9.1  GFRNONAA >60 >60  PROT  --  8.2*  ALBUMIN  --  4.8  AST  --  24  ALT  --  21  ALKPHOS  --  122  BILITOT  --  1.2    RADIOGRAPHIC STUDIES: I have personally reviewed the radiological images as listed and agreed with the findings in the report. CT ABDOMEN PELVIS W CONTRAST  Result Date: 05/06/2022 CLINICAL DATA:  Liver lesion seen on prior scan; * Tracking Code: BO * EXAM: CT ABDOMEN AND  PELVIS WITH CONTRAST TECHNIQUE: Multidetector CT imaging of the abdomen and pelvis was performed using the standard protocol following bolus administration of intravenous contrast. RADIATION DOSE REDUCTION: This exam was performed according to the departmental dose-optimization program which includes automated exposure control, adjustment of the mA and/or kV according to patient size and/or use of iterative reconstruction technique. CONTRAST:  147m OMNIPAQUE IOHEXOL 300 MG/ML  SOLN COMPARISON:  Chest CT dated April 30, 2022 FINDINGS: Lower chest: No acute abnormality. Hepatobiliary: Numerous peripherally enhancing liver lesions, reference lesion of the right hepatic lobe measuring 1.8 x 1.5 cm on series 2, image 31. Reference lesion of the central liver measuring 2.0 x 1.9 cm on image 19 reference lesion of the left lobe of the liver measuring 1.9 x 1.8 cm. Gallbladder is unremarkable. No biliary ductal dilation. Pancreas: Mass centered at the pancreatic tail and splenic hilum, pancreatic tail component measures 4.1 x 2.6 cm. Approximately 180 abutment of the splenic vein. Spleen: Numerous large irregular splenic lesions, largest measures 8.4 x 6.4 cm on series 2, image 30 and is cystic appearing, possibly to splenic necrosis related to pancreatic enzymes. More solid-appearing lesions are seen more centrally, reference lesion measuring 6.1 x 3.4 cm on series 2, image 25. Splenomegaly, measuring up to 16.0 cm. Adrenals/Urinary Tract: Adrenal glands are unremarkable. Kidneys are normal, without renal calculi, focal lesion, or hydronephrosis. Bladder is unremarkable. Stomach/Bowel: Stomach is within normal limits. No evidence of bowel wall thickening, distention, or inflammatory changes. Vascular/Lymphatic: No significant vascular findings are present. No enlarged abdominal or pelvic lymph nodes. Reproductive: Prostate is unremarkable. Other: Peritoneal thickening and nodularity of the left upper quadrant and left  lower quadrant soft tissue nodule measuring 1.2 x 1.1 cm series 2, image 60. No abdominopelvic ascites. Musculoskeletal: Subcutaneous soft tissue nodule of the left lower back measuring 0.6 x 1.3 cm on series 2, image 60. No acute or significant osseous findings. IMPRESSION: 1. Large mass centered at the pancreatic tail and splenic hilum, favored to be primary pancreatic malignancy with direct splenic invasion. Recommend tissue sampling. 2. Numerous liver and splenic lesions, highly concerning for metastatic disease. 3. Peritoneal thickening and nodularity of the left upper quadrant and left lower quadrant soft tissue nodule, highly concerning for peritoneal carcinomatosis. 4. Subcutaneous soft tissue nodule of the left lower back, likely due to injection granuloma, metastatic disease less likely. Electronically Signed   By: LYetta GlassmanM.D.   On: 05/06/2022 16:24   CT Angio Chest Pulmonary Embolism (PE) W or WO  Contrast  Result Date: 04/30/2022 CLINICAL DATA:  Chest pain, DVT. EXAM: CT ANGIOGRAPHY CHEST WITH CONTRAST TECHNIQUE: Multidetector CT imaging of the chest was performed using the standard protocol during bolus administration of intravenous contrast. Multiplanar CT image reconstructions and MIPs were obtained to evaluate the vascular anatomy. RADIATION DOSE REDUCTION: This exam was performed according to the departmental dose-optimization program which includes automated exposure control, adjustment of the mA and/or kV according to patient size and/or use of iterative reconstruction technique. CONTRAST:  41m OMNIPAQUE IOHEXOL 350 MG/ML SOLN COMPARISON:  CT angiogram chest 03/21/2020. CT abdomen pelvis 09/30/2019 FINDINGS: Cardiovascular: There is adequate opacification of the pulmonary arteries to the segmental level. There is segmental right lower lobe pulmonary emboli image 5/174. No other pulmonary emboli are visualized. Heart and aorta are normal in size. There is no pericardial effusion.  Mediastinum/Nodes: No enlarged mediastinal, hilar, or axillary lymph nodes. Thyroid gland, trachea, and esophagus demonstrate no significant findings. Lungs/Pleura: There is a 3 mm ground-glass fissural nodule in the right lower lobe image 6/38 which was not definitely seen on prior. Lungs are otherwise clear. No pleural effusion or pneumothorax. Upper Abdomen: Spleen appears enlarged, a new finding. There are new hypodense splenic lesions which are partially imaged measuring up to 7.5 cm. There also new scattered hypodense liver lesions measuring up to 2.1 cm. Musculoskeletal: No chest wall abnormality. No acute or significant osseous findings. Review of the MIP images confirms the above findings. IMPRESSION: 1. Single right lower lobe segmental pulmonary embolism. No evidence for right heart strain. 2. 3 mm ground-glass pulmonary nodule, new from prior. Per Fleischner Society Guidelines, no routine follow-up imaging is recommended. these guidelines do not apply to immunocompromised patients and patients with cancer. Follow up in patients with significant comorbidities as clinically warranted. For lung cancer screening, adhere to Lung-RADS guidelines. Reference: Radiology. 2017; 284(1):228-43. 3. New splenomegaly with new hypodense splenic lesions measuring up to 7.5 cm. Malignancy not excluded. 4. New multiple hypodense hepatic lesions concerning for metastatic disease. Recommend clinical correlation and follow-up. Electronically Signed   By: ARonney AstersM.D.   On: 04/30/2022 17:50   [place] Im[place] Lower Unilateral Left (DVT)  Result Date: 04/30/2022 CLINICAL DATA:  Left leg pain EXAM: LEFT LOWER EXTREMITY VENOUS DOPPLER ULTRASOUND TECHNIQUE: Gray-scale sonography with graded compression, as well as color Doppler and duplex ultrasound were performed to evaluate the lower extremity deep venous systems from the level of the common femoral vein and including the common femoral, femoral, profunda femoral,  popliteal and calf veins including the posterior tibial, peroneal and gastrocnemius veins when visible. The superficial great saphenous vein was also interrogated. Spectral Doppler was utilized to evaluate flow at rest and with distal augmentation maneuvers in the common femoral, femoral and popliteal veins. COMPARISON:  None Available. FINDINGS: Contralateral Common Femoral Vein: Respiratory phasicity is normal and symmetric with the symptomatic side. No evidence of thrombus. Normal compressibility. Common Femoral Vein: No evidence of thrombus. Normal compressibility, respiratory phasicity and response to augmentation. Saphenofemoral Junction: No evidence of thrombus. Normal compressibility and flow on color Doppler imaging. Profunda Femoral Vein: No evidence of thrombus. Normal compressibility and flow on color Doppler imaging. Femoral Vein: No evidence of thrombus. Normal compressibility, respiratory phasicity and response to augmentation. Popliteal Vein: No evidence of thrombus. Normal compressibility, respiratory phasicity and response to augmentation. Calf Veins: No evidence of thrombus within the posterior tibial or peroneal veins. Normal compressibility and flow on color Doppler imaging. However, the anterior tibial veins are noncompressible in the  lumens are expanded and filled with low-level internal echoes. Superficial Great Saphenous Vein: No evidence of thrombus. Normal compressibility. Venous Reflux:  None. Other Findings: Also positive for thrombosis of superficial venous branches in the posterior calf. IMPRESSION: 1. Positive for isolated calf vein DVT in the anterior tibial veins. 2. Additionally, positive for superficial thrombophlebitis of branch veins in the popliteal fossa. Electronically Signed   By: Jacqulynn Cadet M.D.   On: 04/30/2022 13:49   DG Chest 2 View  Result Date: 04/25/2022 CLINICAL DATA:  Short of breath, chest pain for 6-7 weeks, fell landing on left side EXAM: CHEST - 2  VIEW COMPARISON:  03/20/2020 FINDINGS: Frontal and lateral views of the chest demonstrate an unremarkable cardiac silhouette. No airspace disease, effusion, or pneumothorax. There are no acute displaced fractures. IMPRESSION: 1. No acute intrathoracic process. Electronically Signed   By: Randa Ngo M.D.   On: 04/25/2022 23:43     Pancreatic mass # SEP 28th, 2023-CT scan abdomen pelvis- large mass centered at the pancreatic tail and splenic hilum,favored to be primary pancreatic malignancy with direct splenic invasion.  Numerous liver and splenic lesions, highly concerning for metastatic disease; Peritoneal thickening and nodularity of the left upper quadrant and left lower quadrant soft tissue nodule, highly concerning for peritoneal carcinomatosis. Subcutaneous soft tissue nodule of the left lower back, likely due to injection granuloma, metastatic disease less likely. SEP 2023- PCP [facility] [8]; CBC/CMP -WNL-except for mildly elevated alkaline phosphatase 128.  PSA normal.  CA 19-9 not done.  #Discussed the significant concerns for pancreatic malignancy based on above imaging findings.  Discussed that based on imaging concerning for advanced malignancy.  Surgery not an option.  If pancreatic adenocarcinoma would recommend chemotherapy with FOLFIRINOX as first-line therapy.  Discussed the potential side effects including but not limited to nausea vomiting diarrhea risk of infections neuropathy fatigue etc.  Understands he will need a port if proceeding with chemotherapy.   #Discussed with IR-options include-ultrasound-guided biopsy of the subcutaneous nodule left lower back Vs-liver biopsy.  Since the subcutaneous nodule can be done without holding anticoagulation-we will proceed with biopsy of the skin nodule.  This was discussed with Dr.El-Abd.   #SEP 19th, 20222- Right segmental PE-on anticoagulation-with Eliquis 5 mg twice daily.  Patient can continue Eliquis while getting the subcutaneous skin  biopsy.  Unfortunately patient will need lifelong anticoagulation.   # Thank you Dr. Ola Spurr for allowing me to participate in the care of your pleasant patient. Please do not hesitate to contact me with questions or concerns in the interim.  Discussed with Dr. Ola Spurr.  Also discussed with the patient and wife regarding the option of an opinion [facility] or [place] if interested.  However we will need to await for the biopsy.  # DISPOSITION: # labs-CBC; CMP: Ca-19-9;  # follow up in 10-14 days- MD; no labs- Dr.B  # I reviewed the blood work- with the patient in detail; also reviewed the imaging independently [as summarized above]; and with the patient in detail.      Above plan of care was discussed with patient/family in detail.  My contact information was given to the patient/family.       Cammie Sickle, MD 05/07/2022 10:52 AM

## 2022-05-07 NOTE — Assessment & Plan Note (Addendum)
#   SEP 28th, 2023-CT scan abdomen pelvis- large mass centered at the pancreatic tail and splenic hilum,favored to be primary pancreatic malignancy with direct splenic invasion.  Numerous liver and splenic lesions, highly concerning for metastatic disease; Peritoneal thickening and nodularity of the left upper quadrant and left lower quadrant soft tissue nodule, highly concerning for peritoneal carcinomatosis. Subcutaneous soft tissue nodule of the left lower back, likely due to injection granuloma, metastatic disease less likely. SEP 2023- PCP [facility] [8]; CBC/CMP -WNL-except for mildly elevated alkaline phosphatase 128.  PSA normal.  CA 19-9 not done.  #Discussed the significant concerns for pancreatic malignancy based on above imaging findings.  Discussed that based on imaging concerning for advanced malignancy.  Surgery not an option.  If pancreatic adenocarcinoma would recommend chemotherapy with FOLFIRINOX as first-line therapy.  Discussed the potential side effects including but not limited to nausea vomiting diarrhea risk of infections neuropathy fatigue etc.  Understands he will need a port if proceeding with chemotherapy.   #Discussed with IR-options include-ultrasound-guided biopsy of the subcutaneous nodule left lower back Vs-liver biopsy.  Since the subcutaneous nodule can be done without holding anticoagulation-we will proceed with biopsy of the skin nodule.  This was discussed with Dr.El-Abd.   #SEP 19th, 20222- Right segmental PE-on anticoagulation-with Eliquis 5 mg twice daily.  Patient can continue Eliquis while getting the subcutaneous skin biopsy.  Unfortunately patient will need lifelong anticoagulation.   # Thank you Dr. Ola Spurr for allowing me to participate in the care of your pleasant patient. Please do not hesitate to contact me with questions or concerns in the interim.  Discussed with Dr. Ola Spurr.  Also discussed with the patient and wife regarding the option of an opinion [facility]  or [facility] if interested.  However we will need to await for the biopsy.  # DISPOSITION: # labs-CBC; CMP: Ca-19-9;  # follow up in 10-14 days- MD; no labs- Dr.B  # I reviewed the blood work- with the patient in detail; also reviewed the imaging independently [as summarized above]; and with the patient in detail.

## 2022-05-11 LAB — CANCER ANTIGEN 19-9: CA 19-9: 2618 U/mL — ABNORMAL HIGH (ref 0–35)

## 2022-05-12 ENCOUNTER — Ambulatory Visit
Admission: RE | Admit: 2022-05-12 | Discharge: 2022-05-12 | Disposition: A | Discharge status: Home / Self Care | Payer: Federal, State, Local not specified - PPO | Source: Ambulatory Visit | Attending: Internal Medicine | Admitting: Internal Medicine

## 2022-05-12 DIAGNOSIS — R229 Localized swelling, mass and lump, unspecified: Secondary | ICD-10-CM | POA: Insufficient documentation

## 2022-05-12 DIAGNOSIS — K8689 Other specified diseases of pancreas: Secondary | ICD-10-CM | POA: Insufficient documentation

## 2022-05-12 DIAGNOSIS — C496 Malignant neoplasm of connective and soft tissue of trunk, unspecified: Secondary | ICD-10-CM | POA: Diagnosis not present

## 2022-05-12 DIAGNOSIS — C7989 Secondary malignant neoplasm of other specified sites: Secondary | ICD-10-CM | POA: Diagnosis not present

## 2022-05-12 DIAGNOSIS — C259 Malignant neoplasm of pancreas, unspecified: Secondary | ICD-10-CM | POA: Diagnosis not present

## 2022-05-12 DIAGNOSIS — C801 Malignant (primary) neoplasm, unspecified: Secondary | ICD-10-CM | POA: Diagnosis not present

## 2022-05-12 DIAGNOSIS — M799 Soft tissue disorder, unspecified: Secondary | ICD-10-CM | POA: Diagnosis not present

## 2022-05-13 DIAGNOSIS — R0602 Shortness of breath: Secondary | ICD-10-CM | POA: Diagnosis not present

## 2022-05-13 DIAGNOSIS — R079 Chest pain, unspecified: Secondary | ICD-10-CM | POA: Diagnosis not present

## 2022-05-14 ENCOUNTER — Encounter: Payer: Self-pay | Admitting: Internal Medicine

## 2022-05-14 ENCOUNTER — Other Ambulatory Visit: Payer: Self-pay | Admitting: Anatomic Pathology & Clinical Pathology

## 2022-05-14 ENCOUNTER — Telehealth: Payer: Self-pay | Admitting: Internal Medicine

## 2022-05-14 LAB — SURGICAL PATHOLOGY

## 2022-05-14 NOTE — Telephone Encounter (Signed)
Bx was done on 10/4 and results are not available at this time.

## 2022-05-14 NOTE — Telephone Encounter (Signed)
pt called in requesting his Biopsy results be sent to him via My chart. Expecting to have [facility] visit and would like to have results .Marland KitchenCan be reach at work if needed [phone] ext [phone].Marland KitchenKJ

## 2022-05-15 ENCOUNTER — Other Ambulatory Visit: Payer: Self-pay | Admitting: Internal Medicine

## 2022-05-15 NOTE — Progress Notes (Signed)
I spoke to pt and wife re: results of the biopsy- Clinically suggestive of pancreatic cancer. Pt also awaiting second opinion at [facility] on 10/09. Pt will follow up with me as planned on 10/11. Added to tumor conference. Also ordered [facility] one.  GB

## 2022-05-18 ENCOUNTER — Telehealth: Payer: Self-pay | Admitting: Internal Medicine

## 2022-05-18 DIAGNOSIS — C787 Secondary malignant neoplasm of liver and intrahepatic bile duct: Secondary | ICD-10-CM | POA: Diagnosis not present

## 2022-05-18 DIAGNOSIS — C259 Malignant neoplasm of pancreas, unspecified: Secondary | ICD-10-CM | POA: Diagnosis not present

## 2022-05-18 NOTE — Telephone Encounter (Signed)
Patient called and requested to cancel upcoming appointment. He will be receiving care at [facility].

## 2022-05-19 ENCOUNTER — Inpatient Hospital Stay: Payer: Federal, State, Local not specified - PPO | Admitting: Internal Medicine

## 2022-05-20 ENCOUNTER — Other Ambulatory Visit: Payer: Federal, State, Local not specified - PPO

## 2022-05-20 DIAGNOSIS — R0602 Shortness of breath: Secondary | ICD-10-CM | POA: Diagnosis not present

## 2022-05-20 DIAGNOSIS — R079 Chest pain, unspecified: Secondary | ICD-10-CM | POA: Diagnosis not present

## 2022-05-20 DIAGNOSIS — I2693 Single subsegmental pulmonary embolism without acute cor pulmonale: Secondary | ICD-10-CM | POA: Diagnosis not present

## 2022-05-20 DIAGNOSIS — I82442 Acute embolism and thrombosis of left tibial vein: Secondary | ICD-10-CM | POA: Diagnosis not present

## 2022-05-20 NOTE — Progress Notes (Signed)
Tumor Board Documentation  Kevin Gregory was presented by Dr Rogue Bussing at our Tumor Board on 05/20/2022, which included representatives from medical oncology, pathology, radiology, surgical, pharmacy, pulmonology, genetics, radiation oncology, navigation, research, internal medicine.  Kevin Gregory currently presents as a new patient, for [place], for new positive pathology with history of the following treatments: surgical intervention(s), active survellience.  Additionally, we reviewed previous medical and familial history, history of present illness, and recent lab results along with all available histopathologic and imaging studies. The tumor board considered available treatment options and made the following recommendations:   Patient has opted to seek care at [facility]  The following procedures/referrals were also placed: No orders of the defined types were placed in this encounter.   Clinical Trial Status: not discussed   Staging used: Pathologic Stage AJCC Staging:       Group: Stage IV Metastatic Poorly Diffwerentiated CArcinoma probable Pancreatic origin   National site-specific guidelines   were discussed with respect to the case.  Tumor board is a meeting of clinicians from various specialty areas who evaluate and discuss patients for whom a multidisciplinary approach is being considered. Final determinations in the plan of care are those of the provider(s). The responsibility for follow up of recommendations given during tumor board is that of the provider.   Today's extended care, comprehensive team conference, Kevin Gregory was not present for the discussion and was not examined.   Multidisciplinary Tumor Board is a multidisciplinary case peer review process.  Decisions discussed in the Multidisciplinary Tumor Board reflect the opinions of the specialists present at the conference without having examined the patient.  Ultimately, treatment and diagnostic decisions rest with the primary  provider(s) and the patient.

## 2022-05-25 ENCOUNTER — Encounter: Payer: Self-pay | Admitting: Internal Medicine

## 2022-05-27 DIAGNOSIS — L7622 Postprocedural hemorrhage and hematoma of skin and subcutaneous tissue following other procedure: Secondary | ICD-10-CM | POA: Diagnosis not present

## 2022-05-27 DIAGNOSIS — C7989 Secondary malignant neoplasm of other specified sites: Secondary | ICD-10-CM | POA: Diagnosis not present

## 2022-05-27 DIAGNOSIS — Z7901 Long term (current) use of anticoagulants: Secondary | ICD-10-CM | POA: Diagnosis not present

## 2022-05-27 DIAGNOSIS — C7889 Secondary malignant neoplasm of other digestive organs: Secondary | ICD-10-CM | POA: Diagnosis not present

## 2022-05-27 DIAGNOSIS — Z86711 Personal history of pulmonary embolism: Secondary | ICD-10-CM | POA: Diagnosis not present

## 2022-05-27 DIAGNOSIS — C787 Secondary malignant neoplasm of liver and intrahepatic bile duct: Secondary | ICD-10-CM | POA: Diagnosis not present

## 2022-05-27 DIAGNOSIS — M79661 Pain in right lower leg: Secondary | ICD-10-CM | POA: Diagnosis not present

## 2022-05-27 DIAGNOSIS — I82463 Acute embolism and thrombosis of calf muscular vein, bilateral: Secondary | ICD-10-CM | POA: Diagnosis not present

## 2022-05-27 DIAGNOSIS — D7389 Other diseases of spleen: Secondary | ICD-10-CM | POA: Diagnosis not present

## 2022-05-27 DIAGNOSIS — T148XXA Other injury of unspecified body region, initial encounter: Secondary | ICD-10-CM | POA: Diagnosis not present

## 2022-05-27 DIAGNOSIS — C259 Malignant neoplasm of pancreas, unspecified: Secondary | ICD-10-CM | POA: Diagnosis not present

## 2022-05-27 DIAGNOSIS — K661 Hemoperitoneum: Secondary | ICD-10-CM | POA: Diagnosis not present

## 2022-05-27 DIAGNOSIS — C801 Malignant (primary) neoplasm, unspecified: Secondary | ICD-10-CM | POA: Diagnosis not present

## 2022-05-27 DIAGNOSIS — C786 Secondary malignant neoplasm of retroperitoneum and peritoneum: Secondary | ICD-10-CM | POA: Diagnosis not present

## 2022-05-27 DIAGNOSIS — Z1152 Encounter for screening for COVID-19: Secondary | ICD-10-CM | POA: Diagnosis not present

## 2022-05-28 ENCOUNTER — Encounter: Payer: Self-pay | Admitting: Internal Medicine

## 2022-05-28 DIAGNOSIS — J9811 Atelectasis: Secondary | ICD-10-CM | POA: Diagnosis not present

## 2022-05-28 DIAGNOSIS — C259 Malignant neoplasm of pancreas, unspecified: Secondary | ICD-10-CM | POA: Diagnosis not present

## 2022-05-28 DIAGNOSIS — L7622 Postprocedural hemorrhage and hematoma of skin and subcutaneous tissue following other procedure: Secondary | ICD-10-CM | POA: Diagnosis not present

## 2022-05-28 DIAGNOSIS — K661 Hemoperitoneum: Secondary | ICD-10-CM | POA: Diagnosis not present

## 2022-05-28 DIAGNOSIS — J90 Pleural effusion, not elsewhere classified: Secondary | ICD-10-CM | POA: Diagnosis not present

## 2022-05-28 DIAGNOSIS — C786 Secondary malignant neoplasm of retroperitoneum and peritoneum: Secondary | ICD-10-CM | POA: Diagnosis not present

## 2022-05-28 DIAGNOSIS — C252 Malignant neoplasm of tail of pancreas: Secondary | ICD-10-CM | POA: Diagnosis not present

## 2022-05-28 DIAGNOSIS — I2699 Other pulmonary embolism without acute cor pulmonale: Secondary | ICD-10-CM | POA: Diagnosis not present

## 2022-05-28 DIAGNOSIS — R109 Unspecified abdominal pain: Secondary | ICD-10-CM | POA: Diagnosis not present

## 2022-05-28 DIAGNOSIS — C7889 Secondary malignant neoplasm of other digestive organs: Secondary | ICD-10-CM | POA: Diagnosis not present

## 2022-05-28 DIAGNOSIS — M79661 Pain in right lower leg: Secondary | ICD-10-CM | POA: Diagnosis not present

## 2022-05-28 DIAGNOSIS — T148XXA Other injury of unspecified body region, initial encounter: Secondary | ICD-10-CM | POA: Diagnosis not present

## 2022-05-28 DIAGNOSIS — I82463 Acute embolism and thrombosis of calf muscular vein, bilateral: Secondary | ICD-10-CM | POA: Diagnosis not present

## 2022-05-29 DIAGNOSIS — I82463 Acute embolism and thrombosis of calf muscular vein, bilateral: Secondary | ICD-10-CM | POA: Diagnosis not present

## 2022-05-29 DIAGNOSIS — T148XXA Other injury of unspecified body region, initial encounter: Secondary | ICD-10-CM | POA: Diagnosis not present

## 2022-05-29 DIAGNOSIS — C259 Malignant neoplasm of pancreas, unspecified: Secondary | ICD-10-CM | POA: Diagnosis not present

## 2022-05-29 DIAGNOSIS — R109 Unspecified abdominal pain: Secondary | ICD-10-CM | POA: Diagnosis not present

## 2022-05-29 DIAGNOSIS — K661 Hemoperitoneum: Secondary | ICD-10-CM | POA: Diagnosis not present

## 2022-05-30 DIAGNOSIS — R109 Unspecified abdominal pain: Secondary | ICD-10-CM | POA: Diagnosis not present

## 2022-05-30 DIAGNOSIS — I82463 Acute embolism and thrombosis of calf muscular vein, bilateral: Secondary | ICD-10-CM | POA: Diagnosis not present

## 2022-05-30 DIAGNOSIS — C259 Malignant neoplasm of pancreas, unspecified: Secondary | ICD-10-CM | POA: Diagnosis not present

## 2022-05-30 DIAGNOSIS — T148XXA Other injury of unspecified body region, initial encounter: Secondary | ICD-10-CM | POA: Diagnosis not present

## 2022-05-30 DIAGNOSIS — K661 Hemoperitoneum: Secondary | ICD-10-CM | POA: Diagnosis not present

## 2022-05-31 DIAGNOSIS — C787 Secondary malignant neoplasm of liver and intrahepatic bile duct: Secondary | ICD-10-CM | POA: Diagnosis not present

## 2022-05-31 DIAGNOSIS — C259 Malignant neoplasm of pancreas, unspecified: Secondary | ICD-10-CM | POA: Diagnosis not present

## 2022-06-04 DIAGNOSIS — I2693 Single subsegmental pulmonary embolism without acute cor pulmonale: Secondary | ICD-10-CM | POA: Diagnosis not present

## 2022-06-04 DIAGNOSIS — C259 Malignant neoplasm of pancreas, unspecified: Secondary | ICD-10-CM | POA: Diagnosis not present

## 2022-06-04 DIAGNOSIS — I82442 Acute embolism and thrombosis of left tibial vein: Secondary | ICD-10-CM | POA: Diagnosis not present

## 2022-06-08 DIAGNOSIS — Z87891 Personal history of nicotine dependence: Secondary | ICD-10-CM | POA: Diagnosis not present

## 2022-06-08 DIAGNOSIS — C259 Malignant neoplasm of pancreas, unspecified: Secondary | ICD-10-CM | POA: Diagnosis not present

## 2022-06-08 DIAGNOSIS — Z86711 Personal history of pulmonary embolism: Secondary | ICD-10-CM | POA: Diagnosis not present

## 2022-06-08 DIAGNOSIS — C787 Secondary malignant neoplasm of liver and intrahepatic bile duct: Secondary | ICD-10-CM | POA: Diagnosis not present

## 2022-06-08 DIAGNOSIS — Z7901 Long term (current) use of anticoagulants: Secondary | ICD-10-CM | POA: Diagnosis not present

## 2022-06-08 DIAGNOSIS — Z5111 Encounter for antineoplastic chemotherapy: Secondary | ICD-10-CM | POA: Diagnosis not present

## 2022-06-08 DIAGNOSIS — Z79899 Other long term (current) drug therapy: Secondary | ICD-10-CM | POA: Diagnosis not present

## 2022-06-08 DIAGNOSIS — Z86718 Personal history of other venous thrombosis and embolism: Secondary | ICD-10-CM | POA: Diagnosis not present

## 2022-06-08 DIAGNOSIS — K5900 Constipation, unspecified: Secondary | ICD-10-CM | POA: Diagnosis not present

## 2022-06-08 DIAGNOSIS — Z9049 Acquired absence of other specified parts of digestive tract: Secondary | ICD-10-CM | POA: Diagnosis not present

## 2022-06-11 DIAGNOSIS — K5900 Constipation, unspecified: Secondary | ICD-10-CM | POA: Diagnosis not present

## 2022-06-11 DIAGNOSIS — Z87442 Personal history of urinary calculi: Secondary | ICD-10-CM | POA: Diagnosis not present

## 2022-06-11 DIAGNOSIS — K869 Disease of pancreas, unspecified: Secondary | ICD-10-CM | POA: Diagnosis not present

## 2022-06-11 DIAGNOSIS — R112 Nausea with vomiting, unspecified: Secondary | ICD-10-CM | POA: Diagnosis not present

## 2022-06-11 DIAGNOSIS — C259 Malignant neoplasm of pancreas, unspecified: Secondary | ICD-10-CM | POA: Diagnosis not present

## 2022-06-11 DIAGNOSIS — K766 Portal hypertension: Secondary | ICD-10-CM | POA: Diagnosis not present

## 2022-06-11 DIAGNOSIS — R1084 Generalized abdominal pain: Secondary | ICD-10-CM | POA: Diagnosis not present

## 2022-06-11 DIAGNOSIS — Z1152 Encounter for screening for COVID-19: Secondary | ICD-10-CM | POA: Diagnosis not present

## 2022-06-11 DIAGNOSIS — R180 Malignant ascites: Secondary | ICD-10-CM | POA: Diagnosis not present

## 2022-06-11 DIAGNOSIS — K219 Gastro-esophageal reflux disease without esophagitis: Secondary | ICD-10-CM | POA: Diagnosis not present

## 2022-06-11 DIAGNOSIS — J9811 Atelectasis: Secondary | ICD-10-CM | POA: Diagnosis not present

## 2022-06-11 DIAGNOSIS — Z86718 Personal history of other venous thrombosis and embolism: Secondary | ICD-10-CM | POA: Diagnosis not present

## 2022-06-11 DIAGNOSIS — G893 Neoplasm related pain (acute) (chronic): Secondary | ICD-10-CM | POA: Diagnosis not present

## 2022-06-11 DIAGNOSIS — R918 Other nonspecific abnormal finding of lung field: Secondary | ICD-10-CM | POA: Diagnosis not present

## 2022-06-11 DIAGNOSIS — I82890 Acute embolism and thrombosis of other specified veins: Secondary | ICD-10-CM | POA: Diagnosis not present

## 2022-06-11 DIAGNOSIS — R509 Fever, unspecified: Secondary | ICD-10-CM | POA: Diagnosis not present

## 2022-06-11 DIAGNOSIS — E8809 Other disorders of plasma-protein metabolism, not elsewhere classified: Secondary | ICD-10-CM | POA: Diagnosis not present

## 2022-06-11 DIAGNOSIS — C787 Secondary malignant neoplasm of liver and intrahepatic bile duct: Secondary | ICD-10-CM | POA: Diagnosis not present

## 2022-06-11 DIAGNOSIS — Z87891 Personal history of nicotine dependence: Secondary | ICD-10-CM | POA: Diagnosis not present

## 2022-06-11 DIAGNOSIS — C786 Secondary malignant neoplasm of retroperitoneum and peritoneum: Secondary | ICD-10-CM | POA: Diagnosis not present

## 2022-06-11 DIAGNOSIS — D735 Infarction of spleen: Secondary | ICD-10-CM | POA: Diagnosis not present

## 2022-06-11 DIAGNOSIS — J90 Pleural effusion, not elsewhere classified: Secondary | ICD-10-CM | POA: Diagnosis not present

## 2022-06-11 DIAGNOSIS — E440 Moderate protein-calorie malnutrition: Secondary | ICD-10-CM | POA: Diagnosis not present

## 2022-06-11 DIAGNOSIS — K652 Spontaneous bacterial peritonitis: Secondary | ICD-10-CM | POA: Diagnosis not present

## 2022-06-11 DIAGNOSIS — R0602 Shortness of breath: Secondary | ICD-10-CM | POA: Diagnosis not present

## 2022-06-11 DIAGNOSIS — C252 Malignant neoplasm of tail of pancreas: Secondary | ICD-10-CM | POA: Diagnosis not present

## 2022-06-11 DIAGNOSIS — K5903 Drug induced constipation: Secondary | ICD-10-CM | POA: Diagnosis not present

## 2022-06-12 DIAGNOSIS — R509 Fever, unspecified: Secondary | ICD-10-CM | POA: Diagnosis not present

## 2022-06-13 DIAGNOSIS — R509 Fever, unspecified: Secondary | ICD-10-CM | POA: Diagnosis not present

## 2022-06-13 DIAGNOSIS — G893 Neoplasm related pain (acute) (chronic): Secondary | ICD-10-CM | POA: Diagnosis not present

## 2022-06-13 DIAGNOSIS — R180 Malignant ascites: Secondary | ICD-10-CM | POA: Diagnosis not present

## 2022-06-13 DIAGNOSIS — R1084 Generalized abdominal pain: Secondary | ICD-10-CM | POA: Diagnosis not present

## 2022-06-14 DIAGNOSIS — R1084 Generalized abdominal pain: Secondary | ICD-10-CM | POA: Diagnosis not present

## 2022-06-14 DIAGNOSIS — R180 Malignant ascites: Secondary | ICD-10-CM | POA: Diagnosis not present

## 2022-06-14 DIAGNOSIS — R509 Fever, unspecified: Secondary | ICD-10-CM | POA: Diagnosis not present

## 2022-06-14 DIAGNOSIS — G893 Neoplasm related pain (acute) (chronic): Secondary | ICD-10-CM | POA: Diagnosis not present

## 2022-06-15 DIAGNOSIS — G893 Neoplasm related pain (acute) (chronic): Secondary | ICD-10-CM | POA: Diagnosis not present

## 2022-06-16 DIAGNOSIS — R509 Fever, unspecified: Secondary | ICD-10-CM | POA: Diagnosis not present

## 2022-06-17 DIAGNOSIS — R509 Fever, unspecified: Secondary | ICD-10-CM | POA: Diagnosis not present

## 2022-06-17 DIAGNOSIS — R188 Other ascites: Secondary | ICD-10-CM | POA: Diagnosis not present

## 2022-06-18 DIAGNOSIS — G893 Neoplasm related pain (acute) (chronic): Secondary | ICD-10-CM | POA: Diagnosis not present

## 2022-06-21 DIAGNOSIS — G893 Neoplasm related pain (acute) (chronic): Secondary | ICD-10-CM | POA: Diagnosis not present

## 2022-06-21 DIAGNOSIS — G894 Chronic pain syndrome: Secondary | ICD-10-CM | POA: Diagnosis not present

## 2022-06-21 DIAGNOSIS — F1190 Opioid use, unspecified, uncomplicated: Secondary | ICD-10-CM | POA: Diagnosis not present

## 2022-06-21 DIAGNOSIS — R1084 Generalized abdominal pain: Secondary | ICD-10-CM | POA: Diagnosis not present

## 2022-06-22 DIAGNOSIS — C252 Malignant neoplasm of tail of pancreas: Secondary | ICD-10-CM | POA: Diagnosis not present

## 2022-06-22 DIAGNOSIS — R188 Other ascites: Secondary | ICD-10-CM | POA: Diagnosis not present

## 2022-06-22 DIAGNOSIS — Z87891 Personal history of nicotine dependence: Secondary | ICD-10-CM | POA: Diagnosis not present

## 2022-06-22 DIAGNOSIS — R1110 Vomiting, unspecified: Secondary | ICD-10-CM | POA: Diagnosis not present

## 2022-06-22 DIAGNOSIS — Z7901 Long term (current) use of anticoagulants: Secondary | ICD-10-CM | POA: Diagnosis not present

## 2022-06-22 DIAGNOSIS — C259 Malignant neoplasm of pancreas, unspecified: Secondary | ICD-10-CM | POA: Diagnosis not present

## 2022-06-22 DIAGNOSIS — C787 Secondary malignant neoplasm of liver and intrahepatic bile duct: Secondary | ICD-10-CM | POA: Diagnosis not present

## 2022-06-22 DIAGNOSIS — Z79899 Other long term (current) drug therapy: Secondary | ICD-10-CM | POA: Diagnosis not present

## 2022-06-22 DIAGNOSIS — C786 Secondary malignant neoplasm of retroperitoneum and peritoneum: Secondary | ICD-10-CM | POA: Diagnosis not present

## 2022-06-22 DIAGNOSIS — K5900 Constipation, unspecified: Secondary | ICD-10-CM | POA: Diagnosis not present

## 2022-06-22 DIAGNOSIS — G893 Neoplasm related pain (acute) (chronic): Secondary | ICD-10-CM | POA: Diagnosis not present

## 2022-06-22 DIAGNOSIS — Z5111 Encounter for antineoplastic chemotherapy: Secondary | ICD-10-CM | POA: Diagnosis not present

## 2022-06-22 DIAGNOSIS — N5089 Other specified disorders of the male genital organs: Secondary | ICD-10-CM | POA: Diagnosis not present

## 2022-06-22 DIAGNOSIS — Z86711 Personal history of pulmonary embolism: Secondary | ICD-10-CM | POA: Diagnosis not present

## 2022-06-23 DIAGNOSIS — R180 Malignant ascites: Secondary | ICD-10-CM | POA: Diagnosis not present

## 2022-06-23 DIAGNOSIS — C259 Malignant neoplasm of pancreas, unspecified: Secondary | ICD-10-CM | POA: Diagnosis not present

## 2022-06-25 DIAGNOSIS — C787 Secondary malignant neoplasm of liver and intrahepatic bile duct: Secondary | ICD-10-CM | POA: Diagnosis not present

## 2022-06-25 DIAGNOSIS — R1084 Generalized abdominal pain: Secondary | ICD-10-CM | POA: Diagnosis not present

## 2022-06-25 DIAGNOSIS — R180 Malignant ascites: Secondary | ICD-10-CM | POA: Diagnosis not present

## 2022-06-25 DIAGNOSIS — Z8719 Personal history of other diseases of the digestive system: Secondary | ICD-10-CM | POA: Diagnosis not present

## 2022-06-25 DIAGNOSIS — Z6827 Body mass index (BMI) 27.0-27.9, adult: Secondary | ICD-10-CM | POA: Diagnosis not present

## 2022-06-25 DIAGNOSIS — Z87891 Personal history of nicotine dependence: Secondary | ICD-10-CM | POA: Diagnosis not present

## 2022-06-25 DIAGNOSIS — G894 Chronic pain syndrome: Secondary | ICD-10-CM | POA: Diagnosis not present

## 2022-06-25 DIAGNOSIS — F1190 Opioid use, unspecified, uncomplicated: Secondary | ICD-10-CM | POA: Diagnosis not present

## 2022-06-25 DIAGNOSIS — C259 Malignant neoplasm of pancreas, unspecified: Secondary | ICD-10-CM | POA: Diagnosis not present

## 2022-06-25 DIAGNOSIS — K219 Gastro-esophageal reflux disease without esophagitis: Secondary | ICD-10-CM | POA: Diagnosis not present

## 2022-06-25 DIAGNOSIS — Z86711 Personal history of pulmonary embolism: Secondary | ICD-10-CM | POA: Diagnosis not present

## 2022-06-25 DIAGNOSIS — E440 Moderate protein-calorie malnutrition: Secondary | ICD-10-CM | POA: Diagnosis not present

## 2022-06-25 DIAGNOSIS — G893 Neoplasm related pain (acute) (chronic): Secondary | ICD-10-CM | POA: Diagnosis not present

## 2022-06-25 DIAGNOSIS — Z86718 Personal history of other venous thrombosis and embolism: Secondary | ICD-10-CM | POA: Diagnosis not present

## 2022-06-26 DIAGNOSIS — R0609 Other forms of dyspnea: Secondary | ICD-10-CM | POA: Diagnosis not present

## 2022-06-26 DIAGNOSIS — K652 Spontaneous bacterial peritonitis: Secondary | ICD-10-CM | POA: Diagnosis not present

## 2022-06-26 DIAGNOSIS — R188 Other ascites: Secondary | ICD-10-CM | POA: Diagnosis not present

## 2022-06-26 DIAGNOSIS — J9811 Atelectasis: Secondary | ICD-10-CM | POA: Diagnosis not present

## 2022-06-26 DIAGNOSIS — K5903 Drug induced constipation: Secondary | ICD-10-CM | POA: Diagnosis not present

## 2022-06-26 DIAGNOSIS — E871 Hypo-osmolality and hyponatremia: Secondary | ICD-10-CM | POA: Diagnosis not present

## 2022-06-26 DIAGNOSIS — R590 Localized enlarged lymph nodes: Secondary | ICD-10-CM | POA: Diagnosis not present

## 2022-06-26 DIAGNOSIS — I864 Gastric varices: Secondary | ICD-10-CM | POA: Diagnosis not present

## 2022-06-26 DIAGNOSIS — D649 Anemia, unspecified: Secondary | ICD-10-CM | POA: Diagnosis not present

## 2022-06-26 DIAGNOSIS — C786 Secondary malignant neoplasm of retroperitoneum and peritoneum: Secondary | ICD-10-CM | POA: Diagnosis not present

## 2022-06-26 DIAGNOSIS — C252 Malignant neoplasm of tail of pancreas: Secondary | ICD-10-CM | POA: Diagnosis not present

## 2022-06-26 DIAGNOSIS — Z9889 Other specified postprocedural states: Secondary | ICD-10-CM | POA: Diagnosis not present

## 2022-06-26 DIAGNOSIS — R0600 Dyspnea, unspecified: Secondary | ICD-10-CM | POA: Diagnosis not present

## 2022-06-26 DIAGNOSIS — R0789 Other chest pain: Secondary | ICD-10-CM | POA: Diagnosis not present

## 2022-06-26 DIAGNOSIS — Z7901 Long term (current) use of anticoagulants: Secondary | ICD-10-CM | POA: Diagnosis not present

## 2022-06-26 DIAGNOSIS — D849 Immunodeficiency, unspecified: Secondary | ICD-10-CM | POA: Diagnosis not present

## 2022-06-26 DIAGNOSIS — R1084 Generalized abdominal pain: Secondary | ICD-10-CM | POA: Diagnosis not present

## 2022-06-26 DIAGNOSIS — Z87891 Personal history of nicotine dependence: Secondary | ICD-10-CM | POA: Diagnosis not present

## 2022-06-26 DIAGNOSIS — E8809 Other disorders of plasma-protein metabolism, not elsewhere classified: Secondary | ICD-10-CM | POA: Diagnosis not present

## 2022-06-26 DIAGNOSIS — R180 Malignant ascites: Secondary | ICD-10-CM | POA: Diagnosis not present

## 2022-06-26 DIAGNOSIS — Z86718 Personal history of other venous thrombosis and embolism: Secondary | ICD-10-CM | POA: Diagnosis not present

## 2022-06-26 DIAGNOSIS — E878 Other disorders of electrolyte and fluid balance, not elsewhere classified: Secondary | ICD-10-CM | POA: Diagnosis not present

## 2022-06-26 DIAGNOSIS — C787 Secondary malignant neoplasm of liver and intrahepatic bile duct: Secondary | ICD-10-CM | POA: Diagnosis not present

## 2022-06-26 DIAGNOSIS — R079 Chest pain, unspecified: Secondary | ICD-10-CM | POA: Diagnosis not present

## 2022-06-26 DIAGNOSIS — K869 Disease of pancreas, unspecified: Secondary | ICD-10-CM | POA: Diagnosis not present

## 2022-06-28 DIAGNOSIS — R188 Other ascites: Secondary | ICD-10-CM | POA: Diagnosis not present

## 2022-06-29 DIAGNOSIS — R180 Malignant ascites: Secondary | ICD-10-CM | POA: Diagnosis not present

## 2022-06-29 DIAGNOSIS — C259 Malignant neoplasm of pancreas, unspecified: Secondary | ICD-10-CM | POA: Diagnosis not present

## 2022-07-02 DIAGNOSIS — C787 Secondary malignant neoplasm of liver and intrahepatic bile duct: Secondary | ICD-10-CM | POA: Diagnosis not present

## 2022-07-02 DIAGNOSIS — K219 Gastro-esophageal reflux disease without esophagitis: Secondary | ICD-10-CM | POA: Diagnosis not present

## 2022-07-02 DIAGNOSIS — Z7901 Long term (current) use of anticoagulants: Secondary | ICD-10-CM | POA: Diagnosis not present

## 2022-07-02 DIAGNOSIS — G893 Neoplasm related pain (acute) (chronic): Secondary | ICD-10-CM | POA: Diagnosis not present

## 2022-07-02 DIAGNOSIS — K5900 Constipation, unspecified: Secondary | ICD-10-CM | POA: Diagnosis not present

## 2022-07-02 DIAGNOSIS — Z4803 Encounter for change or removal of drains: Secondary | ICD-10-CM | POA: Diagnosis not present

## 2022-07-02 DIAGNOSIS — R180 Malignant ascites: Secondary | ICD-10-CM | POA: Diagnosis not present

## 2022-07-02 DIAGNOSIS — C252 Malignant neoplasm of tail of pancreas: Secondary | ICD-10-CM | POA: Diagnosis not present

## 2022-07-02 DIAGNOSIS — E871 Hypo-osmolality and hyponatremia: Secondary | ICD-10-CM | POA: Diagnosis not present

## 2022-07-02 DIAGNOSIS — I82891 Chronic embolism and thrombosis of other specified veins: Secondary | ICD-10-CM | POA: Diagnosis not present

## 2022-07-02 DIAGNOSIS — I864 Gastric varices: Secondary | ICD-10-CM | POA: Diagnosis not present

## 2022-07-02 DIAGNOSIS — R5381 Other malaise: Secondary | ICD-10-CM | POA: Diagnosis not present

## 2022-07-02 DIAGNOSIS — K652 Spontaneous bacterial peritonitis: Secondary | ICD-10-CM | POA: Diagnosis not present

## 2022-07-05 DIAGNOSIS — C787 Secondary malignant neoplasm of liver and intrahepatic bile duct: Secondary | ICD-10-CM | POA: Diagnosis not present

## 2022-07-05 DIAGNOSIS — I864 Gastric varices: Secondary | ICD-10-CM | POA: Diagnosis not present

## 2022-07-05 DIAGNOSIS — K652 Spontaneous bacterial peritonitis: Secondary | ICD-10-CM | POA: Diagnosis not present

## 2022-07-05 DIAGNOSIS — R5381 Other malaise: Secondary | ICD-10-CM | POA: Diagnosis not present

## 2022-07-05 DIAGNOSIS — K5900 Constipation, unspecified: Secondary | ICD-10-CM | POA: Diagnosis not present

## 2022-07-05 DIAGNOSIS — E871 Hypo-osmolality and hyponatremia: Secondary | ICD-10-CM | POA: Diagnosis not present

## 2022-07-05 DIAGNOSIS — C252 Malignant neoplasm of tail of pancreas: Secondary | ICD-10-CM | POA: Diagnosis not present

## 2022-07-05 DIAGNOSIS — K219 Gastro-esophageal reflux disease without esophagitis: Secondary | ICD-10-CM | POA: Diagnosis not present

## 2022-07-05 DIAGNOSIS — I82891 Chronic embolism and thrombosis of other specified veins: Secondary | ICD-10-CM | POA: Diagnosis not present

## 2022-07-05 DIAGNOSIS — Z7901 Long term (current) use of anticoagulants: Secondary | ICD-10-CM | POA: Diagnosis not present

## 2022-07-05 DIAGNOSIS — G893 Neoplasm related pain (acute) (chronic): Secondary | ICD-10-CM | POA: Diagnosis not present

## 2022-07-05 DIAGNOSIS — Z4803 Encounter for change or removal of drains: Secondary | ICD-10-CM | POA: Diagnosis not present

## 2022-07-05 DIAGNOSIS — R180 Malignant ascites: Secondary | ICD-10-CM | POA: Diagnosis not present

## 2022-07-06 DIAGNOSIS — R634 Abnormal weight loss: Secondary | ICD-10-CM | POA: Diagnosis not present

## 2022-07-06 DIAGNOSIS — Z7901 Long term (current) use of anticoagulants: Secondary | ICD-10-CM | POA: Diagnosis not present

## 2022-07-06 DIAGNOSIS — C787 Secondary malignant neoplasm of liver and intrahepatic bile duct: Secondary | ICD-10-CM | POA: Diagnosis not present

## 2022-07-06 DIAGNOSIS — Z79899 Other long term (current) drug therapy: Secondary | ICD-10-CM | POA: Diagnosis not present

## 2022-07-06 DIAGNOSIS — Z87891 Personal history of nicotine dependence: Secondary | ICD-10-CM | POA: Diagnosis not present

## 2022-07-06 DIAGNOSIS — Z9049 Acquired absence of other specified parts of digestive tract: Secondary | ICD-10-CM | POA: Diagnosis not present

## 2022-07-06 DIAGNOSIS — C786 Secondary malignant neoplasm of retroperitoneum and peritoneum: Secondary | ICD-10-CM | POA: Diagnosis not present

## 2022-07-06 DIAGNOSIS — R5381 Other malaise: Secondary | ICD-10-CM | POA: Diagnosis not present

## 2022-07-06 DIAGNOSIS — R188 Other ascites: Secondary | ICD-10-CM | POA: Diagnosis not present

## 2022-07-06 DIAGNOSIS — K5900 Constipation, unspecified: Secondary | ICD-10-CM | POA: Diagnosis not present

## 2022-07-06 DIAGNOSIS — Z6825 Body mass index (BMI) 25.0-25.9, adult: Secondary | ICD-10-CM | POA: Diagnosis not present

## 2022-07-06 DIAGNOSIS — G893 Neoplasm related pain (acute) (chronic): Secondary | ICD-10-CM | POA: Diagnosis not present

## 2022-07-06 DIAGNOSIS — C252 Malignant neoplasm of tail of pancreas: Secondary | ICD-10-CM | POA: Diagnosis not present

## 2022-07-06 DIAGNOSIS — R112 Nausea with vomiting, unspecified: Secondary | ICD-10-CM | POA: Diagnosis not present

## 2022-07-06 DIAGNOSIS — C259 Malignant neoplasm of pancreas, unspecified: Secondary | ICD-10-CM | POA: Diagnosis not present

## 2022-07-06 DIAGNOSIS — Z86711 Personal history of pulmonary embolism: Secondary | ICD-10-CM | POA: Diagnosis not present

## 2022-07-08 DIAGNOSIS — I864 Gastric varices: Secondary | ICD-10-CM | POA: Diagnosis not present

## 2022-07-08 DIAGNOSIS — K219 Gastro-esophageal reflux disease without esophagitis: Secondary | ICD-10-CM | POA: Diagnosis not present

## 2022-07-08 DIAGNOSIS — R180 Malignant ascites: Secondary | ICD-10-CM | POA: Diagnosis not present

## 2022-07-08 DIAGNOSIS — K652 Spontaneous bacterial peritonitis: Secondary | ICD-10-CM | POA: Diagnosis not present

## 2022-07-08 DIAGNOSIS — C787 Secondary malignant neoplasm of liver and intrahepatic bile duct: Secondary | ICD-10-CM | POA: Diagnosis not present

## 2022-07-08 DIAGNOSIS — Z7901 Long term (current) use of anticoagulants: Secondary | ICD-10-CM | POA: Diagnosis not present

## 2022-07-08 DIAGNOSIS — C252 Malignant neoplasm of tail of pancreas: Secondary | ICD-10-CM | POA: Diagnosis not present

## 2022-07-08 DIAGNOSIS — I82891 Chronic embolism and thrombosis of other specified veins: Secondary | ICD-10-CM | POA: Diagnosis not present

## 2022-07-08 DIAGNOSIS — K5900 Constipation, unspecified: Secondary | ICD-10-CM | POA: Diagnosis not present

## 2022-07-08 DIAGNOSIS — G893 Neoplasm related pain (acute) (chronic): Secondary | ICD-10-CM | POA: Diagnosis not present

## 2022-07-08 DIAGNOSIS — Z4803 Encounter for change or removal of drains: Secondary | ICD-10-CM | POA: Diagnosis not present

## 2022-07-08 DIAGNOSIS — E871 Hypo-osmolality and hyponatremia: Secondary | ICD-10-CM | POA: Diagnosis not present

## 2022-07-08 DIAGNOSIS — R5381 Other malaise: Secondary | ICD-10-CM | POA: Diagnosis not present

## 2022-07-10 ENCOUNTER — Other Ambulatory Visit: Payer: Self-pay

## 2022-07-10 ENCOUNTER — Inpatient Hospital Stay
Admission: EM | Admit: 2022-07-10 | Discharge: 2022-07-13 | DRG: 178 | Disposition: A | Discharge status: Hospice | Payer: Federal, State, Local not specified - PPO | Attending: Internal Medicine | Admitting: Internal Medicine

## 2022-07-10 ENCOUNTER — Emergency Department: Payer: Federal, State, Local not specified - PPO

## 2022-07-10 DIAGNOSIS — C787 Secondary malignant neoplasm of liver and intrahepatic bile duct: Secondary | ICD-10-CM | POA: Diagnosis not present

## 2022-07-10 DIAGNOSIS — R5381 Other malaise: Secondary | ICD-10-CM

## 2022-07-10 DIAGNOSIS — E872 Acidosis, unspecified: Secondary | ICD-10-CM | POA: Diagnosis not present

## 2022-07-10 DIAGNOSIS — Z6824 Body mass index (BMI) 24.0-24.9, adult: Secondary | ICD-10-CM

## 2022-07-10 DIAGNOSIS — R0600 Dyspnea, unspecified: Secondary | ICD-10-CM

## 2022-07-10 DIAGNOSIS — R180 Malignant ascites: Secondary | ICD-10-CM | POA: Diagnosis not present

## 2022-07-10 DIAGNOSIS — R069 Unspecified abnormalities of breathing: Secondary | ICD-10-CM | POA: Diagnosis not present

## 2022-07-10 DIAGNOSIS — J9601 Acute respiratory failure with hypoxia: Secondary | ICD-10-CM | POA: Diagnosis not present

## 2022-07-10 DIAGNOSIS — R54 Age-related physical debility: Secondary | ICD-10-CM | POA: Diagnosis present

## 2022-07-10 DIAGNOSIS — K8689 Other specified diseases of pancreas: Secondary | ICD-10-CM | POA: Diagnosis present

## 2022-07-10 DIAGNOSIS — R0902 Hypoxemia: Secondary | ICD-10-CM | POA: Diagnosis not present

## 2022-07-10 DIAGNOSIS — C7989 Secondary malignant neoplasm of other specified sites: Secondary | ICD-10-CM | POA: Diagnosis present

## 2022-07-10 DIAGNOSIS — I864 Gastric varices: Secondary | ICD-10-CM | POA: Diagnosis present

## 2022-07-10 DIAGNOSIS — Z515 Encounter for palliative care: Secondary | ICD-10-CM

## 2022-07-10 DIAGNOSIS — R64 Cachexia: Secondary | ICD-10-CM | POA: Diagnosis not present

## 2022-07-10 DIAGNOSIS — R112 Nausea with vomiting, unspecified: Secondary | ICD-10-CM | POA: Diagnosis present

## 2022-07-10 DIAGNOSIS — Z66 Do not resuscitate: Secondary | ICD-10-CM | POA: Diagnosis not present

## 2022-07-10 DIAGNOSIS — Z813 Family history of other psychoactive substance abuse and dependence: Secondary | ICD-10-CM

## 2022-07-10 DIAGNOSIS — E860 Dehydration: Secondary | ICD-10-CM | POA: Diagnosis not present

## 2022-07-10 DIAGNOSIS — Z87891 Personal history of nicotine dependence: Secondary | ICD-10-CM

## 2022-07-10 DIAGNOSIS — Z806 Family history of leukemia: Secondary | ICD-10-CM | POA: Diagnosis not present

## 2022-07-10 DIAGNOSIS — K920 Hematemesis: Secondary | ICD-10-CM | POA: Diagnosis not present

## 2022-07-10 DIAGNOSIS — N179 Acute kidney failure, unspecified: Secondary | ICD-10-CM | POA: Diagnosis present

## 2022-07-10 DIAGNOSIS — F419 Anxiety disorder, unspecified: Secondary | ICD-10-CM | POA: Diagnosis not present

## 2022-07-10 DIAGNOSIS — R000 Tachycardia, unspecified: Secondary | ICD-10-CM

## 2022-07-10 DIAGNOSIS — Z86718 Personal history of other venous thrombosis and embolism: Secondary | ICD-10-CM

## 2022-07-10 DIAGNOSIS — Z7901 Long term (current) use of anticoagulants: Secondary | ICD-10-CM

## 2022-07-10 DIAGNOSIS — J690 Pneumonitis due to inhalation of food and vomit: Secondary | ICD-10-CM | POA: Diagnosis not present

## 2022-07-10 DIAGNOSIS — Z1152 Encounter for screening for COVID-19: Secondary | ICD-10-CM | POA: Diagnosis not present

## 2022-07-10 DIAGNOSIS — D72829 Elevated white blood cell count, unspecified: Secondary | ICD-10-CM | POA: Diagnosis present

## 2022-07-10 DIAGNOSIS — C252 Malignant neoplasm of tail of pancreas: Secondary | ICD-10-CM | POA: Diagnosis present

## 2022-07-10 DIAGNOSIS — R0689 Other abnormalities of breathing: Secondary | ICD-10-CM | POA: Diagnosis not present

## 2022-07-10 DIAGNOSIS — C259 Malignant neoplasm of pancreas, unspecified: Secondary | ICD-10-CM | POA: Diagnosis not present

## 2022-07-10 DIAGNOSIS — E46 Unspecified protein-calorie malnutrition: Secondary | ICD-10-CM | POA: Diagnosis not present

## 2022-07-10 DIAGNOSIS — G893 Neoplasm related pain (acute) (chronic): Secondary | ICD-10-CM | POA: Diagnosis not present

## 2022-07-10 DIAGNOSIS — E875 Hyperkalemia: Secondary | ICD-10-CM | POA: Diagnosis present

## 2022-07-10 DIAGNOSIS — R0602 Shortness of breath: Secondary | ICD-10-CM | POA: Diagnosis not present

## 2022-07-10 DIAGNOSIS — C801 Malignant (primary) neoplasm, unspecified: Secondary | ICD-10-CM | POA: Diagnosis not present

## 2022-07-10 DIAGNOSIS — Z79899 Other long term (current) drug therapy: Secondary | ICD-10-CM

## 2022-07-10 DIAGNOSIS — Z818 Family history of other mental and behavioral disorders: Secondary | ICD-10-CM

## 2022-07-10 DIAGNOSIS — A419 Sepsis, unspecified organism: Secondary | ICD-10-CM | POA: Diagnosis not present

## 2022-07-10 DIAGNOSIS — Z885 Allergy status to narcotic agent status: Secondary | ICD-10-CM

## 2022-07-10 LAB — CBC WITH DIFFERENTIAL/PLATELET
Abs Immature Granulocytes: 2.14 10*3/uL — ABNORMAL HIGH (ref 0.00–0.07)
Basophils Absolute: 0.4 10*3/uL — ABNORMAL HIGH (ref 0.0–0.1)
Basophils Relative: 1 %
Eosinophils Absolute: 0.3 10*3/uL (ref 0.0–0.5)
Eosinophils Relative: 1 %
HCT: 35.6 % — ABNORMAL LOW (ref 39.0–52.0)
Hemoglobin: 11.3 g/dL — ABNORMAL LOW (ref 13.0–17.0)
Immature Granulocytes: 4 %
Lymphocytes Relative: 2 %
Lymphs Abs: 1.2 10*3/uL (ref 0.7–4.0)
MCH: 27.6 pg (ref 26.0–34.0)
MCHC: 31.7 g/dL (ref 30.0–36.0)
MCV: 86.8 fL (ref 80.0–100.0)
Monocytes Absolute: 2.9 10*3/uL — ABNORMAL HIGH (ref 0.1–1.0)
Monocytes Relative: 6 %
Neutro Abs: 43.7 10*3/uL — ABNORMAL HIGH (ref 1.7–7.7)
Neutrophils Relative %: 86 %
Platelets: 541 10*3/uL — ABNORMAL HIGH (ref 150–400)
RBC: 4.1 MIL/uL — ABNORMAL LOW (ref 4.22–5.81)
RDW: 15.4 % (ref 11.5–15.5)
Smear Review: NORMAL
WBC: 50.5 10*3/uL (ref 4.0–10.5)
nRBC: 0 % (ref 0.0–0.2)

## 2022-07-10 LAB — COMPREHENSIVE METABOLIC PANEL
ALT: 21 U/L (ref 0–44)
AST: 27 U/L (ref 15–41)
Albumin: 2.2 g/dL — ABNORMAL LOW (ref 3.5–5.0)
Alkaline Phosphatase: 243 U/L — ABNORMAL HIGH (ref 38–126)
Anion gap: 10 (ref 5–15)
BUN: 42 mg/dL — ABNORMAL HIGH (ref 6–20)
CO2: 24 mmol/L (ref 22–32)
Calcium: 8 mg/dL — ABNORMAL LOW (ref 8.9–10.3)
Chloride: 96 mmol/L — ABNORMAL LOW (ref 98–111)
Creatinine, Ser: 1.49 mg/dL — ABNORMAL HIGH (ref 0.61–1.24)
GFR, Estimated: 58 mL/min — ABNORMAL LOW (ref >60)
Glucose, Bld: 125 mg/dL — ABNORMAL HIGH (ref 70–99)
Potassium: 5.5 mmol/L — ABNORMAL HIGH (ref 3.5–5.1)
Sodium: 130 mmol/L — ABNORMAL LOW (ref 135–145)
Total Bilirubin: 1 mg/dL (ref 0.3–1.2)
Total Protein: 5.6 g/dL — ABNORMAL LOW (ref 6.5–8.1)

## 2022-07-10 LAB — MAGNESIUM: Magnesium: 2.4 mg/dL (ref 1.7–2.4)

## 2022-07-10 LAB — LACTIC ACID, PLASMA
Lactic Acid, Venous: 2.1 mmol/L (ref 0.5–1.9)
Lactic Acid, Venous: 2.2 mmol/L (ref 0.5–1.9)
Lactic Acid, Venous: 3.8 mmol/L (ref 0.5–1.9)

## 2022-07-10 LAB — RESP PANEL BY RT-PCR (FLU A&B, COVID) ARPGX2
Influenza A by PCR: NEGATIVE
Influenza B by PCR: NEGATIVE
SARS Coronavirus 2 by RT PCR: NEGATIVE

## 2022-07-10 LAB — PROTIME-INR
INR: 1.8 — ABNORMAL HIGH (ref 0.8–1.2)
Prothrombin Time: 21.1 seconds — ABNORMAL HIGH (ref 11.4–15.2)

## 2022-07-10 LAB — APTT: aPTT: 25 seconds (ref 24–36)

## 2022-07-10 LAB — LIPASE, BLOOD: Lipase: 25 U/L (ref 11–51)

## 2022-07-10 LAB — TROPONIN I (HIGH SENSITIVITY)
Troponin I (High Sensitivity): 8 ng/L (ref <18)
Troponin I (High Sensitivity): 7 ng/L (ref <18)

## 2022-07-10 LAB — PROCALCITONIN: Procalcitonin: 0.86 ng/mL

## 2022-07-10 LAB — HIV ANTIBODY (ROUTINE TESTING W REFLEX): HIV Screen 4th Generation wRfx: NONREACTIVE

## 2022-07-10 MED ORDER — HYDROMORPHONE HCL 1 MG/ML IJ SOLN
1.0000 mg | INTRAMUSCULAR | Status: DC | PRN
  Administered 2022-07-10 – 2022-07-13 (×19): 1 mg via INTRAVENOUS
  Filled 2022-07-10 (×19): qty 1

## 2022-07-10 MED ORDER — APIXABAN 5 MG PO TABS
5.0000 mg | ORAL_TABLET | Freq: Two times a day (BID) | ORAL | Status: DC
  Administered 2022-07-10 – 2022-07-12 (×5): 5 mg via ORAL
  Filled 2022-07-10 (×8): qty 1

## 2022-07-10 MED ORDER — LACTATED RINGERS IV BOLUS (SEPSIS)
1000.0000 mL | Freq: Once | INTRAVENOUS | Status: AC
  Administered 2022-07-10: 1000 mL via INTRAVENOUS

## 2022-07-10 MED ORDER — LACTULOSE 10 GM/15ML PO SOLN
10.0000 g | Freq: Three times a day (TID) | ORAL | Status: DC | PRN
  Administered 2022-07-12: 20 g via ORAL
  Filled 2022-07-10: qty 30

## 2022-07-10 MED ORDER — VANCOMYCIN HCL IN DEXTROSE 1-5 GM/200ML-% IV SOLN
1000.0000 mg | Freq: Once | INTRAVENOUS | Status: AC
  Administered 2022-07-10: 1000 mg via INTRAVENOUS
  Filled 2022-07-10: qty 200

## 2022-07-10 MED ORDER — SODIUM CHLORIDE 0.9 % IV SOLN
3.0000 g | Freq: Four times a day (QID) | INTRAVENOUS | Status: AC
  Administered 2022-07-10 – 2022-07-12 (×7): 3 g via INTRAVENOUS
  Filled 2022-07-10 (×9): qty 8

## 2022-07-10 MED ORDER — HYDROMORPHONE HCL 1 MG/ML IJ SOLN
1.0000 mg | INTRAMUSCULAR | Status: AC
  Administered 2022-07-10: 1 mg via INTRAVENOUS
  Filled 2022-07-10: qty 1

## 2022-07-10 MED ORDER — BACLOFEN 10 MG PO TABS
10.0000 mg | ORAL_TABLET | Freq: Three times a day (TID) | ORAL | Status: DC
  Administered 2022-07-10 – 2022-07-12 (×7): 10 mg via ORAL
  Filled 2022-07-10 (×10): qty 1

## 2022-07-10 MED ORDER — ONDANSETRON HCL 4 MG/2ML IJ SOLN
4.0000 mg | Freq: Four times a day (QID) | INTRAMUSCULAR | Status: DC | PRN
  Administered 2022-07-10 – 2022-07-11 (×3): 4 mg via INTRAVENOUS
  Filled 2022-07-10 (×3): qty 2

## 2022-07-10 MED ORDER — IOHEXOL 350 MG/ML SOLN
100.0000 mL | Freq: Once | INTRAVENOUS | Status: AC | PRN
  Administered 2022-07-10: 100 mL via INTRAVENOUS

## 2022-07-10 MED ORDER — SODIUM ZIRCONIUM CYCLOSILICATE 10 G PO PACK
10.0000 g | PACK | Freq: Once | ORAL | Status: DC
  Filled 2022-07-10: qty 1

## 2022-07-10 MED ORDER — GABAPENTIN 100 MG PO CAPS
100.0000 mg | ORAL_CAPSULE | Freq: Three times a day (TID) | ORAL | Status: DC
  Administered 2022-07-10 – 2022-07-12 (×6): 100 mg via ORAL
  Filled 2022-07-10 (×10): qty 1

## 2022-07-10 MED ORDER — OLANZAPINE 5 MG PO TABS
5.0000 mg | ORAL_TABLET | Freq: Every day | ORAL | Status: DC
  Administered 2022-07-10 – 2022-07-11 (×2): 5 mg via ORAL
  Filled 2022-07-10 (×4): qty 1

## 2022-07-10 MED ORDER — ALBUTEROL SULFATE HFA 108 (90 BASE) MCG/ACT IN AERS
2.0000 | INHALATION_SPRAY | RESPIRATORY_TRACT | Status: DC | PRN
  Filled 2022-07-10: qty 6.7

## 2022-07-10 MED ORDER — SODIUM CHLORIDE 0.9 % IV SOLN
500.0000 mg | Freq: Once | INTRAVENOUS | Status: AC
  Administered 2022-07-10: 500 mg via INTRAVENOUS
  Filled 2022-07-10: qty 5

## 2022-07-10 MED ORDER — SODIUM CHLORIDE 0.9 % IV SOLN
2.0000 g | Freq: Once | INTRAVENOUS | Status: AC
  Administered 2022-07-10: 2 g via INTRAVENOUS
  Filled 2022-07-10: qty 12.5

## 2022-07-10 MED ORDER — LACTATED RINGERS IV SOLN: INTRAVENOUS | Status: DC

## 2022-07-10 MED ORDER — PANTOPRAZOLE SODIUM 40 MG PO TBEC
40.0000 mg | DELAYED_RELEASE_TABLET | Freq: Every day | ORAL | Status: DC
  Administered 2022-07-11 – 2022-07-12 (×2): 40 mg via ORAL
  Filled 2022-07-10 (×3): qty 1

## 2022-07-10 MED ORDER — ONDANSETRON HCL 4 MG PO TABS: 4.0000 mg | ORAL_TABLET | Freq: Four times a day (QID) | ORAL | Status: DC | PRN

## 2022-07-10 MED ORDER — PANCRELIPASE (LIP-PROT-AMYL) 12000-38000 UNITS PO CPEP
24000.0000 [IU] | ORAL_CAPSULE | Freq: Three times a day (TID) | ORAL | Status: DC
  Administered 2022-07-12: 24000 [IU] via ORAL
  Filled 2022-07-10 (×4): qty 2

## 2022-07-10 MED ORDER — LACTATED RINGERS BOLUS PEDS
1000.0000 mL | Freq: Once | INTRAVENOUS | Status: AC
  Administered 2022-07-10: 1000 mL via INTRAVENOUS

## 2022-07-10 NOTE — Assessment & Plan Note (Signed)
Patient admitted with worsening shortness of breath and concern of aspiration with recurrent vomiting.  Initially requiring 2 L of oxygen with transient hypoxia.  No baseline oxygen use.  Listed as sepsis due to leukocytosis and mild tachycardia. Procalcitonin elevated at 0.86 and chest imaging with concern of aspiration pneumonia.-Received cefepime in and vancomycin in ED. Blood cultures were drawn.  Patient has chronic leukocytosis and severe debilitation due to advanced malignancy, most likely SIRS response.  Sepsis ruled out. -Start him on Unasyn for concern of aspiration. -Aspiration precautions

## 2022-07-10 NOTE — H&P (Signed)
History and Physical    Patient: Kevin Gregory RXV:[id number] DOB: 06-Jan-1976 DOA: 07/10/2022 DOS: the patient was seen and examined on 07/10/2022 PCP: Leonel Ramsay, MD  Patient coming from: Home  Chief Complaint:  Chief Complaint  Patient presents with   Shortness of Breath   HPI: Kevin Gregory is a 46 y.o. male with medical history significant of stage IV pancreatic cancer presented to ED with worsening shortness of breath.  Patient has progressively worsening exertional dyspnea with acute onset shortness of breath with persistent vomiting. Patient was also having more pain in his belly with worsening nausea and vomiting. He became short of breath soon after a big vomitus-concern of aspiration. Denies any fever or chills.  Had a drain placed in right side of abdomen for recurrent ascites. Denies any sick contacts.  Discussed with patient about going to [facility] as his care is over there, patient would like to stay at our place to get some management of his nausea, vomiting and aspiration pneumonia and would like to go to cancer center on Tuesday according to his appointment for chemo.  ED course.  Patient was little tachycardic and tachypneic on arrival, saturating 89% which improved to 100% with 2 L of oxygen.  Labs pertinent for WBC at 50.5, hemoglobin 11.3, platelets 541, sodium 130, potassium 5.5, BUN 42, creatinine 1.49.  Alkaline phosphatase at 243.  Troponin negative x 2.  Lactic acid 2.1>>2.2. Blood cultures were drawn. CTA negative for PE but did show dilated esophagus and a airspace disease/pneumonia in the left more than right lower lobes question underlying aspiration. CT abdomen and pelvis with rapid progression of extensive intra-abdominal malignancy affecting the peritoneum with large volume ascites and diffuse peritoneal nodularity. Soft tissue metastasis is also seen in the left gluteal fat.  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  Patient was  given 1 L of bolus , along with cefepime and vancomycin.   Patient has a very guarded prognosis.  Review of Systems: As mentioned in the history of present illness. All other systems reviewed and are negative. Past Medical History:  Diagnosis Date   Erectile dysfunction 12/01/2011   GERD (gastroesophageal reflux disease)    Kidney stones    Nephrolithiasis    Past Surgical History:  Procedure Laterality Date   LAPAROSCOPIC APPENDECTOMY N/A 10/01/2019   Procedure: APPENDECTOMY LAPAROSCOPIC;  Surgeon: Fredirick Maudlin, MD;  Location: [facility] ORS;  Service: General;  Laterality: N/A;   Social History:  reports that he quit smoking about 9 years ago. His smoking use included cigarettes. He has never used smokeless tobacco. He reports current alcohol use. He reports that he does not use drugs.  Allergies  Allergen Reactions   Oxycodone Rash    Family History  Problem Relation Age of Onset   Leukemia Maternal Uncle    Drug abuse Maternal Grandmother     Prior to Admission medications   Medication Sig Start Date End Date Taking? Authorizing Provider  baclofen (LIORESAL) 10 MG tablet Take 10 mg by mouth 3 (three) times daily. 06/17/22 08/16/22 Yes [provider]  ELIQUIS 5 MG TABS tablet Take by mouth. 04/30/22  Yes [provider]  furosemide (LASIX) 20 MG tablet Take 20 mg by mouth daily. 06/22/22 06/22/23 Yes [provider]  gabapentin (NEURONTIN) 100 MG capsule Take 100 mg by mouth 3 (three) times daily. 06/17/22  Yes [provider]  lactulose (CHRONULAC) 10 GM/15ML solution Take 15-30 mLs by mouth 3 (three) times daily as  needed. 06/18/22  Yes [provider]  morphine (MS CONTIN) 60 MG 12 hr tablet Take 60 mg by mouth every 12 (twelve) hours. 07/06/22 08/05/22 Yes [provider]  OLANZapine (ZYPREXA) 5 MG tablet Take 5 mg by mouth at bedtime. 07/05/22 08/04/22 Yes [provider]  Oxycodone HCl 10 MG TABS Take 1-2 tablets  by mouth every 4 (four) hours as needed. 06/18/22 07/18/22 Yes [provider]  Pancrelipase, Lip-Prot-Amyl, 24000-76000 units CPEP Take 2 capsules by mouth 4 (four) times daily -  before meals and at bedtime. Take 2 capsules by mouth 3 (three) times daily with meals And 1 with snacks 06/08/22 06/08/23 Yes [provider]  pantoprazole (PROTONIX) 40 MG tablet Take 40 mg by mouth daily. 06/17/22  Yes [provider]  prochlorperazine (COMPAZINE) 10 MG tablet Take 10 mg by mouth every 6 (six) hours as needed. 06/03/22  Yes [provider]  spironolactone (ALDACTONE) 25 MG tablet Take 1 tablet by mouth 2 (two) times daily. 06/22/22 06/22/23 Yes [provider]  sulfamethoxazole-trimethoprim (BACTRIM) 200-40 MG/5ML suspension Take 20 mLs by mouth daily. Take 20 mLs (160 mg of trimethoprim total) by mouth once daily for 30 days Begin in the evening of discharge then take daily 06/30/22 07/30/22 Yes [provider]  methylphenidate (RITALIN) 5 MG tablet Take 5 mg by mouth 2 (two) times daily. Patient not taking: Reported on 07/10/2022 06/20/22   [provider]  morphine (MS CONTIN) 30 MG 12 hr tablet Take 30 mg by mouth every 12 (twelve) hours. Patient not taking: Reported on 07/10/2022 06/08/22   [provider]  ondansetron (ZOFRAN) 8 MG tablet Take 8 mg by mouth 2 (two) times daily.    [provider]  oxyCODONE (OXY IR/ROXICODONE) 5 MG immediate release tablet Take 5 mg by mouth every 4 (four) hours as needed. Patient not taking: Reported on 07/10/2022 05/30/22   [provider]  tadalafil (CIALIS) 20 MG tablet TAKE 0.5-1 TABLETS (10-20 MG TOTAL) BY MOUTH EVERY OTHER DAY AS NEEDED FOR ERECTILE DYSFUNCTION. 01/29/22   Biagio Borg, MD  traMADol (ULTRAM) 50 MG tablet Take 50 mg by mouth every 6 (six) hours as needed. Patient not taking: Reported on 07/10/2022 05/26/22   [provider]    Physical  Exam: Vitals:   07/10/22 0800 07/10/22 0815 07/10/22 0825 07/10/22 1029  BP: 116/75  116/75   Pulse: (!) 129 (!) 128 (!) 126   Resp: 18 (!) 23 18   Temp:    97.7 F (36.5 C)  TempSrc:    Oral  SpO2:   95%   Weight:      Height:       Vitals:   07/10/22 0800 07/10/22 0815 07/10/22 0825 07/10/22 1029  BP: 116/75  116/75   Pulse: (!) 129 (!) 128 (!) 126   Resp: 18 (!) 23 18   Temp:    97.7 F (36.5 C)  TempSrc:    Oral  SpO2:   95%   Weight:      Height:       General: Vital signs reviewed.  Patient is frail and malnourished, in no acute distress and cooperative with exam.  Head: Normocephalic and atraumatic. Eyes: EOMI, conjunctivae normal, no scleral icterus.  Neck: Supple, trachea midline, normal ROM,  Cardiovascular: Sinus tachycardia Pulmonary/Chest: Clear to auscultation bilaterally, no wheezes, rales, or rhonchi. Abdominal: Soft, mild diffuse tenderness, RUQ drain in place, bowel sounds positive Extremities: No lower extremity edema bilaterally,  pulses symmetric and intact bilaterally.  Neurological: A&O x3, Strength is normal and symmetric bilaterally, cranial nerve II-XII are grossly intact, no focal motor deficit, sensory intact to light touch bilaterally.  Psychiatric: Normal mood and affect.    Data Reviewed: Prior data reviewed as mentioned above.  Assessment and Plan: * Aspiration pneumonia [facility]) Patient admitted with worsening shortness of breath and concern of aspiration with recurrent vomiting.  Initially requiring 2 L of oxygen with transient hypoxia.  No baseline oxygen use.  Listed as sepsis due to leukocytosis and mild tachycardia. Procalcitonin elevated at 0.86 and chest imaging with concern of aspiration pneumonia.-Received cefepime in and vancomycin in ED. Blood cultures were drawn.  Patient has chronic leukocytosis and severe debilitation due to advanced malignancy, most likely SIRS response.  Sepsis ruled out. -Start him on Unasyn for concern of  aspiration. -Aspiration precautions  Nausea & vomiting Patient with recurrent vomiting and was unable to take much p.o. Most likely the cause of aspiration.  Wife would like at least full liquid meals so she can try giving him some Ensure or boost with Zofran. Most likely secondary to advance pancreatic disease. -Give him Zofran -Full liquid diet  Cancer related pain Patient is on MS Contin and oxycodone at home. Currently being treated with Dilaudid.  He would like to hold home medications and continue with Dilaudid while in the hospital to decrease p.o. medications due to nausea and vomiting. -Continue with Dilaudid.   Pancreatic mass Patient with history of stage IV pancreatic cancer with extensive mets involving liver and peritoneum.  Seems like having disease progression despite getting chemotherapy.  Next chemo on Tuesday, 12/4. -Continue with [facility] oncology for further management  Leukocytosis Patient with significant leukocytosis with WBC at 50.  Check care everywhere and per oncology note he was having worsening leukocytosis for a while, there was concern of leukemoid reaction versus any blood dyscrasias which is being investigated. -Continue to monitor -Follow-up with oncology  Ascites, malignant Patient with peritoneal drain in place due to recurrent ascites secondary to his malignancy.  More than 2 L were drained in ED.  Patient does draining every other day. -Continue to drain as needed  History of DVT (deep vein thrombosis) Patient has an history of DVT and found to have pancreatic malignancy while doing hypercoagulability workup. -Continue with home Eliquis  AKI (acute kidney injury) ([place]) AKI with creatinine of 1.49.  Baseline seems to be around 1. -Giving some IV fluid -Monitor renal function -Avoid nephrotoxins  Hyperkalemia Was likely secondary to some AKI and dehydration.  Potassium at 5.5 -Giving 1 dose of Lokelma -Monitor potassium    Advance Care  Planning:   Code Status: Full Code discussed with patient and wife  Consults: None  Family Communication: Discussed with wife at bedside  Severity of Illness: The appropriate patient status for this patient is INPATIENT. Inpatient status is judged to be reasonable and necessary in order to provide the required intensity of service to ensure the patient's safety. The patient's presenting symptoms, physical exam findings, and initial radiographic and laboratory data in the context of their chronic comorbidities is felt to place them at high risk for further clinical deterioration. Furthermore, it is not anticipated that the patient will be medically stable for discharge from the hospital within 2 midnights of admission.   * I certify that at the point of admission it is my clinical judgment that the patient will require inpatient hospital care spanning beyond 2 midnights from the point of  admission due to high intensity of service, high risk for further deterioration and high frequency of surveillance required.*  This record has been created using Systems analyst. Errors have been sought and corrected,but may not always be located. Such creation errors do not reflect on the standard of care.   Author: Lorella Nimrod, MD 07/10/2022 1:33 PM  For on call review [email or web address].

## 2022-07-10 NOTE — Assessment & Plan Note (Signed)
Resolved with IV hydration AKI with creatinine of 1.49 on admission.  Baseline seems to be around 1. -Giving some IV fluid -Monitor renal function -Avoid nephrotoxins

## 2022-07-10 NOTE — Assessment & Plan Note (Signed)
Patient with peritoneal drain in place due to recurrent ascites secondary to his malignancy.  More than 2 L were drained in ED.  Patient does draining every other day. -Continue to drain as needed

## 2022-07-10 NOTE — Assessment & Plan Note (Signed)
Patient has an history of DVT and found to have pancreatic malignancy while doing hypercoagulability workup. -Continue with home Eliquis

## 2022-07-10 NOTE — ED Notes (Addendum)
MD Reesa Chew continued awareness of elevated lactic and VS. States continue fluids and to monitor.Pt resting with family at bedside with no needs. Breathing e/u on 3L [place]. All needs addressed.

## 2022-07-10 NOTE — ED Provider Notes (Signed)
Patient received in signout from Dr. Karma Greaser pending follow-up labs as well as CT imaging.  Patient with significant leukocytosis as well as mild elevation of lactate.  Patient given empiric antibiotics.  CT imaging obtained and shows diffuse metastatic disease which is certainly worrisome as well as likely pneumonia.  Patient appears more comfortable after IV Dilaudid.  Patient is requesting to be admitted here as opposed to transfer to [facility].  I will consult hospitalist.   Merlyn Lot, MD 07/10/22 [phone]

## 2022-07-10 NOTE — Sepsis Progress Note (Addendum)
Elink following code sepsis  1120 provider messaged asking for 3rd lactic

## 2022-07-10 NOTE — Assessment & Plan Note (Signed)
Patient with history of stage IV pancreatic cancer with extensive mets involving liver and peritoneum.  Seems like having disease progression despite getting chemotherapy.  Next chemo on Tuesday, 12/4. -Continue with [facility] oncology for further management

## 2022-07-10 NOTE — Assessment & Plan Note (Signed)
Patient with significant leukocytosis with WBC at 50.  Check care everywhere and per oncology note he was having worsening leukocytosis for a while, there was concern of leukemoid reaction versus any blood dyscrasias which is being investigated. -Continue to monitor -Follow-up with oncology

## 2022-07-10 NOTE — Assessment & Plan Note (Signed)
Patient is on MS Contin and oxycodone at home. Currently being treated with Dilaudid.  He would like to hold home medications and continue with Dilaudid while in the hospital to decrease p.o. medications due to nausea and vomiting. -Continue with Dilaudid.

## 2022-07-10 NOTE — Progress Notes (Addendum)
CODE SEPSIS - PHARMACY COMMUNICATION  **Broad Spectrum Antibiotics should be administered within 1 hour of Sepsis diagnosis**  Time Code Sepsis Called/Page Received: 0722  Antibiotics Ordered: vancomycin, cefepime, azithro  Time of 1st antibiotic administration: 0109  Additional action taken by pharmacy: none  If necessary, Name of Provider/Nurse Contacted: n/a    Pearla Dubonnet ,PharmD Clinical Pharmacist  07/10/2022  8:04 AM

## 2022-07-10 NOTE — Assessment & Plan Note (Signed)
Was likely secondary to some AKI and dehydration.  Potassium at 5.5 -Giving 1 dose of Lokelma -Monitor potassium

## 2022-07-10 NOTE — ED Notes (Addendum)
Spoke with MD Quentin Cornwall, delay in blood cultures d/t poor access. Port is accessed now. Ok to give abx per MD.

## 2022-07-10 NOTE — ED Triage Notes (Signed)
Arrives from home via EMS for SOB. Pt has stage 4 pancreatic cancer with mets to bone, liver, and spleen. Pt noted to have ascites and taps self with home drain every other day. Pt reports for last week he has felt SOB that is worse with standing and exertion. Pt reports rapidly worsened tonight. Pt tachycardic and hypoxic on arrival on RA. Pt alert and oriented. Breathing unlabored but shallow and pt speaking in short sentences.

## 2022-07-10 NOTE — Assessment & Plan Note (Signed)
Patient with recurrent vomiting and was unable to take much p.o. Most likely the cause of aspiration.  Wife would like at least full liquid meals so she can try giving him some Ensure or boost with Zofran. Most likely secondary to advance pancreatic disease. -Make the Zofran as scheduled to see if that will help -Full liquid diet

## 2022-07-10 NOTE — ED Provider Notes (Signed)
[facility] Provider Note    Event Date/Time   First MD Initiated Contact with Patient 07/10/22 [id number]     (approximate)   History   Shortness of Breath   HPI  Kevin Gregory is a 46 y.o. male with history of stage IV pancreatic cancer with metastatic disease to the liver.  Patient started on gem/Abraxane per oncology notes at [facility].  Has a drain in place in the right lower abdomen for draining ascites.  He presents by EMS for acute onset shortness of breath.  He has been feeling increasingly short of breath particular with exertion over the last week or so, but it became much worse tonight.  EMS reports that he was hypoxemic with oxygen level down in the 80s.  On 2 L of oxygen by nasal cannula he is back up to the upper 90s.  He denies having any more pain than usual.  He still feels short of breath and like it is difficult to take deep breaths.  Of note, his cancer was originally found because he was diagnosed with a DVT and the hypercoagulability work-up identified a pancreatic mass.  His wife reports that he is on Eliquis.  He has had no known recent fever.     Physical Exam   Triage Vital Signs: ED Triage Vitals  Enc Vitals Group     BP 07/10/22 0432 103/79     Pulse Rate 07/10/22 0429 (!) 128     Resp 07/10/22 0429 (!) 24     Temp 07/10/22 0432 (!) 97.5 F (36.4 C)     Temp Source 07/10/22 0429 Oral     SpO2 07/10/22 0429 (!) 89 %     Weight 07/10/22 0430 77.1 kg (170 lb)     Height 07/10/22 0430 1.778 m ('5\' 10"'$ )     Head Circumference --      Peak Flow --      Pain Score 07/10/22 0430 8     Pain Loc --      Pain Edu? --      Excl. in [place]? --     Most recent vital signs: Vitals:   07/10/22 0730 07/10/22 0752  BP: 112/77   Pulse: (!) 125   Resp: 14 18  Temp:    SpO2:  95%     General: Ill appearance at baseline, but acutely ill on top of chronic illness. CV:  Tachycardia in the 130s, regular rhythm, normal heart  sounds. Resp:  Increased respiratory effort with intercostal retractions and accessory muscle usage.  Decreased breath sounds throughout.  No coarse breath sounds, no wheezing. Abd:  Distended abdomen consistent with ascites.  Drain in place in right lower abdomen.  Generalized tenderness to palpation throughout but without peritonitis. Other:  Patient is awake and alert speaking in short sentences due to his dyspnea.  Appears chronically ill.   ED Results / Procedures / Treatments   Labs (all labs ordered are listed, but only abnormal results are displayed) Labs Reviewed  CBC WITH DIFFERENTIAL/PLATELET - Abnormal; Notable for the following components:      Result Value   WBC 50.5 (*)    RBC 4.10 (*)    Hemoglobin 11.3 (*)    HCT 35.6 (*)    Platelets 541 (*)    Neutro Abs 43.7 (*)    Monocytes Absolute 2.9 (*)    Basophils Absolute 0.4 (*)    Abs Immature Granulocytes 2.14 (*)    All other components  within normal limits  COMPREHENSIVE METABOLIC PANEL - Abnormal; Notable for the following components:   Sodium 130 (*)    Potassium 5.5 (*)    Chloride 96 (*)    Glucose, Bld 125 (*)    BUN 42 (*)    Creatinine, Ser 1.49 (*)    Calcium 8.0 (*)    Total Protein 5.6 (*)    Albumin 2.2 (*)    Alkaline Phosphatase 243 (*)    GFR, Estimated 58 (*)    All other components within normal limits  LACTIC ACID, PLASMA - Abnormal; Notable for the following components:   Lactic Acid, Venous 2.1 (*)    All other components within normal limits  PROTIME-INR - Abnormal; Notable for the following components:   Prothrombin Time 21.1 (*)    INR 1.8 (*)    All other components within normal limits  RESP PANEL BY RT-PCR (FLU A&B, COVID) ARPGX2  CULTURE, BLOOD (ROUTINE X 2)  CULTURE, BLOOD (ROUTINE X 2)  URINE CULTURE  MAGNESIUM  APTT  LIPASE, BLOOD  LACTIC ACID, PLASMA  URINALYSIS, COMPLETE (UACMP) WITH MICROSCOPIC  TROPONIN I (HIGH SENSITIVITY)  TROPONIN I (HIGH SENSITIVITY)      EKG  ED ECG REPORT I, Hinda Kehr, the attending physician, personally viewed and interpreted this ECG.  Date: 07/10/2022 EKG Time: 04: 38 Rate: 130 Rhythm: Sinus tachycardia with premature atrial complexes QRS Axis: normal Intervals: normal ST/T Wave abnormalities: Non-specific ST segment / T-wave changes, but no clear evidence of acute ischemia. Narrative Interpretation: no definitive evidence of acute ischemia; does not meet STEMI criteria.    RADIOLOGY I viewed and interpreted the patient's two-view chest x-ray.   Possibly some atelectasis but no obvious infiltrate or pneumothorax.  Radiology report concurs with this assessment.    PROCEDURES:  Critical Care performed: Yes, see critical care procedure note(s)  .Critical Care  Performed by: Hinda Kehr, MD Authorized by: Hinda Kehr, MD   Critical care provider statement:    Critical care time (minutes):  45   Critical care time was exclusive of:  Separately billable procedures and treating other patients   Critical care was necessary to treat or prevent imminent or life-threatening deterioration of the following conditions:  Respiratory failure and sepsis   Critical care was time spent personally by me on the following activities:  Development of treatment plan with patient or surrogate, evaluation of patient's response to treatment, examination of patient, obtaining history from patient or surrogate, ordering and performing treatments and interventions, ordering and review of laboratory studies, ordering and review of radiographic studies, pulse oximetry, re-evaluation of patient's condition and review of old charts .1-3 Lead EKG Interpretation  Performed by: Hinda Kehr, MD Authorized by: Hinda Kehr, MD     Interpretation: abnormal     ECG rate:  130   ECG rate assessment: tachycardic     Rhythm: sinus tachycardia     Ectopy: none     Conduction: normal      MEDICATIONS ORDERED IN  ED: Medications  albuterol (VENTOLIN HFA) 108 (90 Base) MCG/ACT inhaler 2 puff (has no administration in time range)  vancomycin (VANCOCIN) IVPB 1000 mg/200 mL premix (has no administration in time range)  ceFEPIme (MAXIPIME) 2 g in sodium chloride 0.9 % 100 mL IVPB (2 g Intravenous New Bag/Given 07/10/22 0747)  azithromycin (ZITHROMAX) 500 mg in sodium chloride 0.9 % 250 mL IVPB (has no administration in time range)  HYDROmorphone (DILAUDID) injection 1 mg (has no administration in time range)  lactated ringers bolus 1,000 mL (1,000 mLs Intravenous New Bag/Given 07/10/22 0620)  HYDROmorphone (DILAUDID) injection 1 mg (1 mg Intravenous Given 07/10/22 0617)     IMPRESSION / MDM / ASSESSMENT AND PLAN / ED COURSE  I reviewed the triage vital signs and the nursing notes.                              Differential diagnosis includes, but is not limited to, pulmonary embolism, worsening ascites leading to respiratory failure, neutropenic fever, healthcare associated pneumonia, ACS, AAS.  Patient's presentation is most consistent with acute presentation with potential threat to life or bodily function.  Labs/studies ordered: Possible sepsis work-up including EKG, two-view chest x-ray, cardiac monitoring, CMP, CBC with differential, lactic acid, blood cultures x2, magnesium, troponin, pro time-INR, APTT, urinalysis, urine culture, respiratory viral panel, lipase.  Interventions ordered currently: LR 1 L IV bolus, oxygen therapy 2 L nasal cannula.  As documented above, two-view chest x-ray is nonspecific and does not clearly indicate an acute abnormality.  Patient was satting in the 16s for EMS and upon arrival to the ED.  His oxygen has come up to the upper 90s now with supplementary oxygen.  He is tachypneic and tachycardic.  I reviewed notes in care everywhere, specifically his office visit with oncology from 07/06/2022 (Dr. Berneice Gandy) and his pain management note from 07/09/2022 (Dr. Rowe Pavy).    This  confirmed his diagnoses and his current chemotherapy treatments, his history of ascites and pedal edema, etc.  Of particular interest was that he had a heart rate of 138 and a blood pressure of 101/67 documented on his visit to the oncology clinic on 07/06/2022.  However he has not had any respiratory failure with hypoxia up until this time.  His wife notes that he has not been taking good oral intake/nutrition for an extended period of time and she is worried he may be dehydrated.  I believe that is possible as well.  I anticipate CTA chest to rule out pulmonary embolism as well as possible CT abdomen/pelvis to look for any other acute abnormality associated with his ongoing cancer, but his main symptom at this point seems to be dyspnea and hypoxia.  He does not need any additional interventions currently but I will reassess.  The patient is on the cardiac monitor to evaluate for evidence of arrhythmia and/or significant heart rate changes.   Clinical Course as of 07/10/22 0756  Sat Jul 10, 2022  0700 Transferring ED care to Dr. Quentin Cornwall to follow up labs and admit vs transfer the patient.  There has been a lengthy delay in getting labs due to difficulty obtaining IV access and port which will flush but unable to draw back blood. [CF]    Clinical Course User Index [CF] Hinda Kehr, MD     FINAL CLINICAL IMPRESSION(S) / ED DIAGNOSES   Final diagnoses:  Acute respiratory failure with hypoxia [facility])  Primary pancreatic cancer with metastasis to other site [facility])  Physical deconditioning  Malignant ascites  Acute dyspnea     Rx / DC Orders   ED Discharge Orders     None        Note:  This document was prepared using Dragon voice recognition software and may include unintentional dictation errors.   Hinda Kehr, MD 07/10/22 [phone]

## 2022-07-10 NOTE — Consult Note (Signed)
PHARMACY -  BRIEF ANTIBIOTIC NOTE   Pharmacy has received consult(s) for Vancomycin and Cefepime from an ED provider.  The patient's profile has been reviewed for ht/wt/allergies/indication/available labs.    One time order(s) placed for Vancomycin 1g IV and Cefepime 2g IV x 1 dose each.  Further antibiotics/pharmacy consults should be ordered by admitting physician if indicated.                       Thank you, Pearla Dubonnet 07/10/2022  7:25 AM

## 2022-07-10 NOTE — Hospital Course (Signed)
Kevin Gregory is a 46 y.o. male with history of stage IV pancreatic cancer presented to ED with worsening shortness of breath.  Patient has progressively worsening exertional dyspnea with acute onset shortness of breath with persistent vomiting. Patient was also having more pain in his belly with worsening nausea and vomiting. He became short of breath soon after a big vomitus-concern of aspiration. Denies any fever or chills.  Had a drain placed in right side of abdomen for recurrent ascites. Denies any sick contacts.  Discussed with patient about going to [facility] as his care is over there, patient would like to stay at our place to get some management of his nausea, vomiting and aspiration pneumonia and would like to go to cancer center on Tuesday according to his appointment for chemo.  ED course.  Patient was little tachycardic and tachypneic on arrival, saturating 89% which improved to 100% with 2 L of oxygen.  Labs pertinent for WBC at 50.5, hemoglobin 11.3, platelets 541, sodium 130, potassium 5.5, BUN 42, creatinine 1.49.  Alkaline phosphatase at 243.  Troponin negative x 2.  Lactic acid 2.1>>2.2. Blood cultures were drawn. CTA negative for PE but did show dilated esophagus and a airspace disease/pneumonia in the left more than right lower lobes question underlying aspiration. CT abdomen and pelvis with rapid progression of extensive intra-abdominal malignancy affecting the peritoneum with large volume ascites and diffuse peritoneal nodularity. Soft tissue metastasis is also seen in the left gluteal fat.  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  Patient was given 1 L of bolus , along with cefepime and vancomycin.  12/3: Patient continued to have significant pain with associated tachycardia.  No significant upper respiratory symptoms.  Continued to have nausea and vomiting, tolerating few sips here and there but unable to tolerate a meal.  Making Zofran's as  scheduled. Adding home MS Contin 60 mg twice daily to IV Dilaudid every 2 hourly. Explained to the patient and family about the risk of respiratory distress with that much of opioid as he would like to remain full code and full scope of care.  Patient with very poor prognosis.  He would like to be discharged tomorrow afternoon so he can go to [place] on Tuesday morning according to his appointment.  12/4: Patient was started on PCA pump for better pain control. Antibiotics were switched to Augmentin solution to help keeping it down. Potassium with some improvement to 5.2 which is borderline.  Lactic acid remains elevated at 2.4, procalcitonin at 1.03.  Still having some pain but bearable, rates at 6-7/10. Wife is requesting discharge tomorrow morning so they can go directly to cancer center at [facility].  Will ambulate with and without oxygen to determine the oxygen requirement.  Patient with very poor prognosis.  Appears to have patient and family is still in denial.  Difficult situation.  Does not want palliative care involvement.  Patient need to follow-up closely with his oncologist for further management.

## 2022-07-11 DIAGNOSIS — E875 Hyperkalemia: Secondary | ICD-10-CM

## 2022-07-11 DIAGNOSIS — J690 Pneumonitis due to inhalation of food and vomit: Secondary | ICD-10-CM | POA: Diagnosis not present

## 2022-07-11 LAB — CBC
HCT: 33.3 % — ABNORMAL LOW (ref 39.0–52.0)
Hemoglobin: 10.8 g/dL — ABNORMAL LOW (ref 13.0–17.0)
MCH: 27.8 pg (ref 26.0–34.0)
MCHC: 32.4 g/dL (ref 30.0–36.0)
MCV: 85.6 fL (ref 80.0–100.0)
Platelets: 454 10*3/uL — ABNORMAL HIGH (ref 150–400)
RBC: 3.89 MIL/uL — ABNORMAL LOW (ref 4.22–5.81)
RDW: 15.7 % — ABNORMAL HIGH (ref 11.5–15.5)
WBC: 49.2 10*3/uL — ABNORMAL HIGH (ref 4.0–10.5)
nRBC: 0 % (ref 0.0–0.2)

## 2022-07-11 LAB — PROCALCITONIN: Procalcitonin: 1.02 ng/mL

## 2022-07-11 LAB — LACTIC ACID, PLASMA: Lactic Acid, Venous: 2.2 mmol/L (ref 0.5–1.9)

## 2022-07-11 LAB — COMPREHENSIVE METABOLIC PANEL
ALT: 17 U/L (ref 0–44)
AST: 25 U/L (ref 15–41)
Albumin: 1.9 g/dL — ABNORMAL LOW (ref 3.5–5.0)
Alkaline Phosphatase: 236 U/L — ABNORMAL HIGH (ref 38–126)
Anion gap: 8 (ref 5–15)
BUN: 35 mg/dL — ABNORMAL HIGH (ref 6–20)
CO2: 26 mmol/L (ref 22–32)
Calcium: 8.3 mg/dL — ABNORMAL LOW (ref 8.9–10.3)
Chloride: 98 mmol/L (ref 98–111)
Creatinine, Ser: 1.07 mg/dL (ref 0.61–1.24)
GFR, Estimated: >60 mL/min (ref >60)
Glucose, Bld: 125 mg/dL — ABNORMAL HIGH (ref 70–99)
Potassium: 5.5 mmol/L — ABNORMAL HIGH (ref 3.5–5.1)
Sodium: 132 mmol/L — ABNORMAL LOW (ref 135–145)
Total Bilirubin: 1 mg/dL (ref 0.3–1.2)
Total Protein: 5.1 g/dL — ABNORMAL LOW (ref 6.5–8.1)

## 2022-07-11 LAB — GLUCOSE, CAPILLARY: Glucose-Capillary: 150 mg/dL — ABNORMAL HIGH (ref 70–99)

## 2022-07-11 MED ORDER — NALOXONE HCL 0.4 MG/ML IJ SOLN
0.4000 mg | INTRAMUSCULAR | Status: DC | PRN
  Administered 2022-07-12: 0.4 mg via INTRAVENOUS
  Filled 2022-07-11 (×2): qty 1

## 2022-07-11 MED ORDER — ALBUTEROL SULFATE (2.5 MG/3ML) 0.083% IN NEBU
10.0000 mg | INHALATION_SOLUTION | Freq: Once | RESPIRATORY_TRACT | Status: AC
  Administered 2022-07-11: 10 mg via RESPIRATORY_TRACT
  Filled 2022-07-11: qty 12

## 2022-07-11 MED ORDER — INSULIN ASPART 100 UNIT/ML IV SOLN
10.0000 [IU] | Freq: Once | INTRAVENOUS | Status: AC
  Administered 2022-07-11: 10 [IU] via INTRAVENOUS
  Filled 2022-07-11: qty 0.1

## 2022-07-11 MED ORDER — DEXTROSE 50 % IV SOLN
1.0000 | Freq: Once | INTRAVENOUS | Status: AC
  Administered 2022-07-11: 50 mL via INTRAVENOUS
  Filled 2022-07-11: qty 50

## 2022-07-11 MED ORDER — SODIUM CHLORIDE 0.9% FLUSH: 9.0000 mL | INTRAVENOUS | Status: DC | PRN

## 2022-07-11 MED ORDER — DIPHENHYDRAMINE HCL 50 MG/ML IJ SOLN: 12.5000 mg | Freq: Four times a day (QID) | INTRAMUSCULAR | Status: DC | PRN

## 2022-07-11 MED ORDER — HYDROMORPHONE 1 MG/ML IV SOLN
INTRAVENOUS | Status: DC
  Administered 2022-07-11: 0.6 mg via INTRAVENOUS
  Administered 2022-07-11: 30 mg via INTRAVENOUS
  Administered 2022-07-11: 0.9 mg via INTRAVENOUS
  Administered 2022-07-12: 2.7 mg via INTRAVENOUS
  Filled 2022-07-11: qty 30

## 2022-07-11 MED ORDER — ONDANSETRON HCL 4 MG/2ML IJ SOLN
4.0000 mg | Freq: Four times a day (QID) | INTRAMUSCULAR | Status: DC
  Administered 2022-07-11 – 2022-07-13 (×9): 4 mg via INTRAVENOUS
  Filled 2022-07-11 (×9): qty 2

## 2022-07-11 MED ORDER — METOPROLOL TARTRATE 5 MG/5ML IV SOLN
5.0000 mg | INTRAVENOUS | Status: AC | PRN
  Administered 2022-07-11 – 2022-07-12 (×2): 5 mg via INTRAVENOUS
  Filled 2022-07-11 (×2): qty 5

## 2022-07-11 MED ORDER — MORPHINE SULFATE ER 15 MG PO TBCR
60.0000 mg | EXTENDED_RELEASE_TABLET | Freq: Two times a day (BID) | ORAL | Status: DC
  Administered 2022-07-11 – 2022-07-12 (×2): 60 mg via ORAL
  Filled 2022-07-11 (×4): qty 4

## 2022-07-11 MED ORDER — ONDANSETRON HCL 4 MG PO TABS
4.0000 mg | ORAL_TABLET | Freq: Four times a day (QID) | ORAL | Status: DC
  Filled 2022-07-11 (×3): qty 1

## 2022-07-11 MED ORDER — DIPHENHYDRAMINE HCL 12.5 MG/5ML PO ELIX: 12.5000 mg | ORAL_SOLUTION | Freq: Four times a day (QID) | ORAL | Status: DC | PRN

## 2022-07-11 NOTE — TOC CM/SW Note (Signed)
Patient with high readmission risk score.  Attempted call to spouse to complete high risk assessment, left VM.  Oleh Genin, [place]

## 2022-07-11 NOTE — Progress Notes (Signed)
Progress Note   Patient: Kevin Gregory ZDG:[id number] DOB: Nov 07, 1975 DOA: 07/10/2022     1 DOS: the patient was seen and examined on 07/11/2022   Brief hospital course: Kevin Gregory is a 46 y.o. male with history of stage IV pancreatic cancer presented to ED with worsening shortness of breath.  Patient has progressively worsening exertional dyspnea with acute onset shortness of breath with persistent vomiting. Patient was also having more pain in his belly with worsening nausea and vomiting. He became short of breath soon after a big vomitus-concern of aspiration. Denies any fever or chills.  Had a drain placed in right side of abdomen for recurrent ascites. Denies any sick contacts.  Discussed with patient about going to [facility] as his care is over there, patient would like to stay at our place to get some management of his nausea, vomiting and aspiration pneumonia and would like to go to cancer center on Tuesday according to his appointment for chemo.  ED course.  Patient was little tachycardic and tachypneic on arrival, saturating 89% which improved to 100% with 2 L of oxygen.  Labs pertinent for WBC at 50.5, hemoglobin 11.3, platelets 541, sodium 130, potassium 5.5, BUN 42, creatinine 1.49.  Alkaline phosphatase at 243.  Troponin negative x 2.  Lactic acid 2.1>>2.2. Blood cultures were drawn. CTA negative for PE but did show dilated esophagus and a airspace disease/pneumonia in the left more than right lower lobes question underlying aspiration. CT abdomen and pelvis with rapid progression of extensive intra-abdominal malignancy affecting the peritoneum with large volume ascites and diffuse peritoneal nodularity. Soft tissue metastasis is also seen in the left gluteal fat.  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  Patient was given 1 L of bolus , along with cefepime and vancomycin.  12/3: Patient continued to have significant pain with associated tachycardia.  No  significant upper respiratory symptoms.  Continued to have nausea and vomiting, tolerating few sips here and there but unable to tolerate a meal.  Making Zofran's as scheduled. Adding home MS Contin 60 mg twice daily to IV Dilaudid every 2 hourly. Explained to the patient and family about the risk of respiratory distress with that much of opioid as he would like to remain full code and full scope of care.  Patient with very poor prognosis.  He would like to be discharged tomorrow afternoon so he can go to [place] on Tuesday morning according to his appointment.   Assessment and Plan: * Aspiration pneumonia [facility]) Patient admitted with worsening shortness of breath and concern of aspiration with recurrent vomiting.  Initially requiring 2 L of oxygen with transient hypoxia.  No baseline oxygen use.  Listed as sepsis due to leukocytosis and mild tachycardia. Procalcitonin elevated at 0.86 and chest imaging with concern of aspiration pneumonia.-Received cefepime in and vancomycin in ED. Blood cultures negative in 1 day.  Patient has chronic leukocytosis and severe debilitation due to advanced malignancy, most likely SIRS response.  Sepsis ruled out. -Start him on Unasyn for concern of aspiration. -Aspiration precautions  Nausea & vomiting Patient with recurrent vomiting and was unable to take much p.o. Most likely the cause of aspiration.  Wife would like at least full liquid meals so she can try giving him some Ensure or boost with Zofran. Most likely secondary to advance pancreatic disease. -Make the Zofran as scheduled to see if that will help -Full liquid diet  Cancer related pain Patient is on MS Contin and oxycodone at home.  Currently being treated with Dilaudid.  Continue to have significant pain. -Restarting home MS Contin 60 mg twice daily -Continue with Dilaudid every 2 hourly as needed.    Pancreatic mass Patient with history of stage IV pancreatic cancer with extensive mets  involving liver and peritoneum.  Seems like having disease progression despite getting chemotherapy.  Next chemo on Tuesday, 12/4. -Continue with [facility] oncology for further management  Leukocytosis Patient with significant leukocytosis with WBC at 50.  Check care everywhere and per oncology note he was having worsening leukocytosis for a while, there was concern of leukemoid reaction versus any blood dyscrasias which is being investigated. -Continue to monitor -Follow-up with oncology  Ascites, malignant Patient with peritoneal drain in place due to recurrent ascites secondary to his malignancy.  More than 2 L were drained in ED.  Patient does draining every other day. -Continue to drain as needed  History of DVT (deep vein thrombosis) Patient has an history of DVT and found to have pancreatic malignancy while doing hypercoagulability workup. -Continue with home Eliquis  AKI (acute kidney injury) ([place]) Resolved with IV hydration AKI with creatinine of 1.49 on admission.  Baseline seems to be around 1. -Giving some IV fluid -Monitor renal function -Avoid nephrotoxins  Hyperkalemia Persistent hyperkalemia with potassium remaining at 5.5.  Unable to take [facility] due to persistent nausea and vomiting. -Give 1 dose of albuterol nebulizer -Give 10 units of insulin with D50 -Continue to monitor   Subjective: Patient was seen and examined today.  Continues to have significant abdominal pain.  Still having quite a bit of nausea and vomiting, able to tolerate couple of sips here and there.  Physical Exam: Vitals:   07/10/22 2114 07/11/22 0424 07/11/22 0751 07/11/22 1538  BP: 108/69 113/78 105/87 105/83  Pulse: (!) 124 (!) 124 (!) 106 (!) 145  Resp: '18 20 20 '$ (!) 22  Temp: 98.2 F (36.8 C) 97.8 F (36.6 C) 98.2 F (36.8 C) 98.2 F (36.8 C)  TempSrc: Oral Oral Oral Oral  SpO2: 97% 98% 97% 97%  Weight:      Height:       General.  Ill-appearing gentleman, in no acute  distress. Pulmonary.  Lungs clear bilaterally, normal respiratory effort. CV.  Regular rate and rhythm, no JVD, rub or murmur. Abdomen.  Soft, diffusely tender, mildly distended, BS positive.  Paracentesis drain in place CNS.  Alert and oriented .  No focal neurologic deficit. Extremities.  No edema, no cyanosis, pulses intact and symmetrical. Psychiatry.  Judgment and insight appears normal.   Data Reviewed: Prior data reviewed  Family Communication: Discussed with wife and mother at bedside  Disposition: Status is: Inpatient Remains inpatient appropriate because: Severity of illness  Planned Discharge Destination: Home  DVT prophylaxis.  Eliquis Time spent: 50 minutes  This record has been created using Systems analyst. Errors have been sought and corrected,but may not always be located. Such creation errors do not reflect on the standard of care.   Author: Lorella Nimrod, MD 07/11/2022 3:53 PM  For on call review [email or web address].

## 2022-07-11 NOTE — Progress Notes (Signed)
   07/11/22 2010  Assess: MEWS Score  Pulse Rate (!) 115  Resp 11  Assess: MEWS Score  MEWS Temp 0  MEWS Systolic 0  MEWS Pulse 2  MEWS RR 1  MEWS LOC 0  MEWS Score 3  MEWS Score Color Yellow  Assess: if the MEWS score is Yellow or Red  Were vital signs taken at a resting state? Yes  Focused Assessment No change from prior assessment  Does the patient meet 2 or more of the SIRS criteria? No  Does the patient have a confirmed or suspected source of infection? No  MEWS guidelines implemented *See Row Information* No, previously red, continue vital signs every 4 hours  Document  Progress note created (see row info) Yes  Assess: SIRS CRITERIA  SIRS Temperature  0  SIRS Pulse 1  SIRS Respirations  0  SIRS WBC 1  SIRS Score Sum  2

## 2022-07-11 NOTE — Progress Notes (Signed)
   07/11/22 1538  Assess: MEWS Score  Temp 98.2 F (36.8 C)  BP 105/83  MAP (mmHg) 92  Pulse Rate (!) 145  Resp (!) 22  SpO2 97 %  O2 Device Nasal Cannula  Assess: MEWS Score  MEWS Temp 0  MEWS Systolic 0  MEWS Pulse 3  MEWS RR 1  MEWS LOC 0  MEWS Score 4  MEWS Score Color Red  Assess: if the MEWS score is Yellow or Red  Were vital signs taken at a resting state? Yes  Focused Assessment No change from prior assessment  Does the patient meet 2 or more of the SIRS criteria? Yes  Does the patient have a confirmed or suspected source of infection? No  MEWS guidelines implemented *See Row Information* Yes  Treat  MEWS Interventions Administered prn meds/treatments;Administered scheduled meds/treatments  Pain Scale 0-10  Pain Score 7  Pain Type Acute pain  Pain Location Abdomen  Pain Intervention(s) Medication (See eMAR)  Take Vital Signs  Increase Vital Sign Frequency  Red: Q 1hr X 4 then Q 4hr X 4, if remains red, continue Q 4hrs  Escalate  MEWS: Escalate Red: discuss with charge nurse/RN and provider, consider discussing with RRT  Notify: Charge Nurse/RN  Name of Charge Nurse/RN Notified Misty Burns RN  Date Charge Nurse/RN Notified 07/11/22  Time Charge Nurse/RN Notified 1531  Provider Notification  Provider Name/Title Dr. Lorella Nimrod  Date Provider Notified 07/11/22  Time Provider Notified 1531  Method of Notification Page  Notification Reason Change in status ([place])  Provider response See new orders  Date of Provider Response 07/11/22  Time of Provider Response 1539  Document  Patient Outcome Stabilized after interventions  Progress note created (see row info) Yes  Assess: SIRS CRITERIA  SIRS Temperature  0  SIRS Pulse 1  SIRS Respirations  1  SIRS WBC 1  SIRS Score Sum  3

## 2022-07-12 ENCOUNTER — Inpatient Hospital Stay: Payer: Federal, State, Local not specified - PPO

## 2022-07-12 DIAGNOSIS — J690 Pneumonitis due to inhalation of food and vomit: Secondary | ICD-10-CM | POA: Diagnosis not present

## 2022-07-12 DIAGNOSIS — K5900 Constipation, unspecified: Secondary | ICD-10-CM | POA: Diagnosis not present

## 2022-07-12 DIAGNOSIS — R000 Tachycardia, unspecified: Secondary | ICD-10-CM

## 2022-07-12 DIAGNOSIS — E872 Acidosis, unspecified: Secondary | ICD-10-CM

## 2022-07-12 LAB — BASIC METABOLIC PANEL
Anion gap: 8 (ref 5–15)
BUN: 41 mg/dL — ABNORMAL HIGH (ref 6–20)
CO2: 27 mmol/L (ref 22–32)
Calcium: 8.1 mg/dL — ABNORMAL LOW (ref 8.9–10.3)
Chloride: 97 mmol/L — ABNORMAL LOW (ref 98–111)
Creatinine, Ser: 1.22 mg/dL (ref 0.61–1.24)
GFR, Estimated: >60 mL/min (ref >60)
Glucose, Bld: 120 mg/dL — ABNORMAL HIGH (ref 70–99)
Potassium: 5.2 mmol/L — ABNORMAL HIGH (ref 3.5–5.1)
Sodium: 132 mmol/L — ABNORMAL LOW (ref 135–145)

## 2022-07-12 LAB — URINALYSIS, COMPLETE (UACMP) WITH MICROSCOPIC
Bacteria, UA: NONE SEEN
Bilirubin Urine: NEGATIVE
Glucose, UA: NEGATIVE mg/dL
Hgb urine dipstick: NEGATIVE
Ketones, ur: 5 mg/dL — AB
Leukocytes,Ua: NEGATIVE
Nitrite: NEGATIVE
Protein, ur: 30 mg/dL — AB
Specific Gravity, Urine: 1.036 — ABNORMAL HIGH (ref 1.005–1.030)
pH: 5 (ref 5.0–8.0)

## 2022-07-12 LAB — LACTIC ACID, PLASMA: Lactic Acid, Venous: 2.4 mmol/L (ref 0.5–1.9)

## 2022-07-12 LAB — GLUCOSE, CAPILLARY: Glucose-Capillary: 128 mg/dL — ABNORMAL HIGH (ref 70–99)

## 2022-07-12 LAB — PROCALCITONIN: Procalcitonin: 1.03 ng/mL

## 2022-07-12 MED ORDER — SODIUM ZIRCONIUM CYCLOSILICATE 10 G PO PACK
10.0000 g | PACK | Freq: Every day | ORAL | Status: DC
  Administered 2022-07-12: 10 g via ORAL
  Filled 2022-07-12 (×2): qty 1

## 2022-07-12 MED ORDER — ALUM & MAG HYDROXIDE-SIMETH 200-200-20 MG/5ML PO SUSP
30.0000 mL | ORAL | Status: DC | PRN
  Administered 2022-07-12: 30 mL via ORAL
  Filled 2022-07-12: qty 30

## 2022-07-12 MED ORDER — AMOXICILLIN-POT CLAVULANATE 400-57 MG/5ML PO SUSR
875.0000 mg | Freq: Two times a day (BID) | ORAL | Status: DC
  Administered 2022-07-12: 875 mg via ORAL
  Filled 2022-07-12 (×5): qty 10.9

## 2022-07-12 MED ORDER — ALBUMIN HUMAN 25 % IV SOLN
50.0000 g | Freq: Once | INTRAVENOUS | Status: AC
  Administered 2022-07-12: 50 g via INTRAVENOUS
  Filled 2022-07-12: qty 200

## 2022-07-12 MED ORDER — OLANZAPINE 10 MG IM SOLR
2.5000 mg | Freq: Once | INTRAMUSCULAR | Status: AC
  Administered 2022-07-12: 2.5 mg via INTRAVENOUS
  Filled 2022-07-12: qty 10

## 2022-07-12 MED ORDER — METOPROLOL TARTRATE 5 MG/5ML IV SOLN
5.0000 mg | INTRAVENOUS | Status: DC | PRN
  Administered 2022-07-12 – 2022-07-13 (×2): 5 mg via INTRAVENOUS
  Filled 2022-07-12 (×2): qty 5

## 2022-07-12 NOTE — Assessment & Plan Note (Signed)
Patient with persistent sinus tachycardia, pain and acidosis both can be contributory. -IV metoprolol as needed

## 2022-07-12 NOTE — Assessment & Plan Note (Signed)
Patient with persistent lactic acidosis despite getting fluid. Most likely secondary to advancement of cancer and persistent nausea and vomiting. -Continue with IV fluid and supportive care

## 2022-07-12 NOTE — Assessment & Plan Note (Signed)
Secondary to an extensive malignancy Patient with recurrent vomiting and was unable to take much p.o. Most likely the cause of aspiration.  Wife would like at least full liquid meals so she can try giving him some Ensure or boost with Zofran. Most likely secondary to advance pancreatic disease. -Make the Zofran as scheduled to see if that will help -Full liquid diet

## 2022-07-12 NOTE — Progress Notes (Signed)
   07/12/22 1559  Assess: MEWS Score  Temp 98.2 F (36.8 C)  BP 94/82  MAP (mmHg) 87  Pulse Rate (!) 135  Resp 20  Assess: MEWS Score  MEWS Temp 0  MEWS Systolic 1  MEWS Pulse 3  MEWS RR 0  MEWS LOC 3  MEWS Score 7  MEWS Score Color Red  Assess: if the MEWS score is Yellow or Red  Were vital signs taken at a resting state? Yes  Focused Assessment Change from prior assessment (see assessment flowsheet)  Does the patient meet 2 or more of the SIRS criteria? Yes  Does the patient have a confirmed or suspected source of infection? Yes  Provider and Rapid Response Notified? Yes  MEWS guidelines implemented *See Row Information* Yes  Treat  MEWS Interventions Administered prn meds/treatments;Escalated (See documentation below)  Take Vital Signs  Increase Vital Sign Frequency  Red: Q 1hr X 4 then Q 4hr X 4, if remains red, continue Q 4hrs  Escalate  MEWS: Escalate Red: discuss with charge nurse/RN and provider, consider discussing with RRT  Notify: Charge Nurse/RN  Name of Charge Nurse/RN Notified Olivia RN  Date Charge Nurse/RN Notified 07/12/22  Time Charge Nurse/RN Notified 1559  Provider Notification  Provider Name/Title Amin MD  Date Provider Notified 07/12/22  Time Provider Notified 1559  Method of Notification Page;Call  Notification Reason Change in status  Provider response Other (Comment) (orders to stopped PCA and give narcan)  Date of Provider Response 07/12/22  Time of Provider Response 1559  Notify: Rapid Response  Date Rapid Response Notified 07/12/22  Time Rapid Response Notified [id number]  Document  Patient Outcome Stabilized after interventions  Assess: SIRS CRITERIA  SIRS Temperature  0  SIRS Pulse 1  SIRS Respirations  0  SIRS WBC 1  SIRS Score Sum  2

## 2022-07-12 NOTE — Progress Notes (Signed)
Dr Reesa Chew notified about pt's declining. HR 130s , pt is lethargic and RR 6-8 per minute. PCA pump stopped. VSS and BS obtained. Medical response team called.   Orders received: To give one of narcan and repeat a second dose in 5 minutes if pt does not improved.  Stop PCA aswell.

## 2022-07-12 NOTE — Progress Notes (Signed)
Provider Notification: Lorella Nimrod MD \  Pt's HR continues in the 130s. Can we please have IV metoprolol order. Pt now is c/o chest tightness and SOB. Should we get an EKG?   Orders received:   Awaiting on orders.

## 2022-07-12 NOTE — Assessment & Plan Note (Signed)
Slight improvement of potassium to 5.2, currently borderline high.  Unable to take [facility] due to persistent nausea and vomiting. -Continue to monitor

## 2022-07-12 NOTE — Progress Notes (Signed)
Provider Notification:  Lorella Nimrod MD   Pt's HR is in the 130s and pt is c/o sever abdominal pain. Bowel sounds are faint and pt has not have a BM in 5 days. Lactulose was given this am with morning meds but we have not had a BM yet. Could we get abdominal xray order to check for bowel obstruction and IV metoprolol ?  Orders received:   Awaiting on orders

## 2022-07-12 NOTE — Assessment & Plan Note (Signed)
Patient was started on PCA pump with Dilaudid-pain remain at 5-7/10. Appears more lethargic-unfortunately we cannot up the dose as he will remain full code with full scope of care. Patient is on MS Contin and oxycodone at home. -Restarting home MS Contin 60 mg twice daily -Continue with Dilaudid PCA

## 2022-07-12 NOTE — Assessment & Plan Note (Addendum)
Patient admitted with worsening shortness of breath and concern of aspiration with recurrent vomiting.  Initially requiring 2 L of oxygen with transient hypoxia.  No baseline oxygen use.  Listed as sepsis due to leukocytosis and mild tachycardia. Procalcitonin elevated at 0.86 and chest imaging with concern of aspiration pneumonia.-Received cefepime in and vancomycin in ED. Blood cultures negative in 2 day.  Patient has chronic leukocytosis and severe debilitation due to advanced malignancy, most likely SIRS response.  Sepsis ruled out. -Switch Unasyn with liquid Augmentin-will complete a 5-day course -Aspiration precautions

## 2022-07-12 NOTE — Progress Notes (Signed)
Progress Note   Patient: Kevin Gregory MVE:[id number] DOB: 06-Jun-1976 DOA: 07/10/2022     2 DOS: the patient was seen and examined on 07/12/2022   Brief hospital course: Kevin Gregory is a 46 y.o. male with history of stage IV pancreatic cancer presented to ED with worsening shortness of breath.  Patient has progressively worsening exertional dyspnea with acute onset shortness of breath with persistent vomiting. Patient was also having more pain in his belly with worsening nausea and vomiting. He became short of breath soon after a big vomitus-concern of aspiration. Denies any fever or chills.  Had a drain placed in right side of abdomen for recurrent ascites. Denies any sick contacts.  Discussed with patient about going to [facility] as his care is over there, patient would like to stay at our place to get some management of his nausea, vomiting and aspiration pneumonia and would like to go to cancer center on Tuesday according to his appointment for chemo.  ED course.  Patient was little tachycardic and tachypneic on arrival, saturating 89% which improved to 100% with 2 L of oxygen.  Labs pertinent for WBC at 50.5, hemoglobin 11.3, platelets 541, sodium 130, potassium 5.5, BUN 42, creatinine 1.49.  Alkaline phosphatase at 243.  Troponin negative x 2.  Lactic acid 2.1>>2.2. Blood cultures were drawn. CTA negative for PE but did show dilated esophagus and a airspace disease/pneumonia in the left more than right lower lobes question underlying aspiration. CT abdomen and pelvis with rapid progression of extensive intra-abdominal malignancy affecting the peritoneum with large volume ascites and diffuse peritoneal nodularity. Soft tissue metastasis is also seen in the left gluteal fat.  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  Patient was given 1 L of bolus , along with cefepime and vancomycin.  12/3: Patient continued to have significant pain with associated tachycardia.  No  significant upper respiratory symptoms.  Continued to have nausea and vomiting, tolerating few sips here and there but unable to tolerate a meal.  Making Zofran's as scheduled. Adding home MS Contin 60 mg twice daily to IV Dilaudid every 2 hourly. Explained to the patient and family about the risk of respiratory distress with that much of opioid as he would like to remain full code and full scope of care.  Patient with very poor prognosis.  He would like to be discharged tomorrow afternoon so he can go to [facility] on Tuesday morning according to his appointment.  12/4: Patient was started on PCA pump for better pain control. Antibiotics were switched to Augmentin solution to help keeping it down. Potassium with some improvement to 5.2 which is borderline.  Lactic acid remains elevated at 2.4, procalcitonin at 1.03.  Still having some pain but bearable, rates at 6-7/10. Wife is requesting discharge tomorrow morning so they can go directly to cancer center at [facility].  Will ambulate with and without oxygen to determine the oxygen requirement.  Patient with very poor prognosis.  Appears to have patient and family is still in denial.  Difficult situation.  Does not want palliative care involvement.  Patient need to follow-up closely with his oncologist for further management.   Assessment and Plan: * Aspiration pneumonia [facility]) Patient admitted with worsening shortness of breath and concern of aspiration with recurrent vomiting.  Initially requiring 2 L of oxygen with transient hypoxia.  No baseline oxygen use.  Listed as sepsis due to leukocytosis and mild tachycardia. Procalcitonin elevated at 0.86 and chest imaging with concern of aspiration  pneumonia.-Received cefepime in and vancomycin in ED. Blood cultures negative in 2 day.  Patient has chronic leukocytosis and severe debilitation due to advanced malignancy, most likely SIRS response.  Sepsis ruled out. -Switch Unasyn with liquid Augmentin-will  complete a 5-day course -Aspiration precautions  Nausea & vomiting Secondary to an extensive malignancy Patient with recurrent vomiting and was unable to take much p.o. Most likely the cause of aspiration.  Wife would like at least full liquid meals so she can try giving him some Ensure or boost with Zofran. Most likely secondary to advance pancreatic disease. -Make the Zofran as scheduled to see if that will help -Full liquid diet  Cancer related pain Patient was started on PCA pump with Dilaudid-pain remain at 5-7/10. Appears more lethargic-unfortunately we cannot up the dose as he will remain full code with full scope of care. Patient is on MS Contin and oxycodone at home. -Restarting home MS Contin 60 mg twice daily -Continue with Dilaudid PCA   Pancreatic mass Patient with history of stage IV pancreatic cancer with extensive mets involving liver and peritoneum.  Seems like having disease progression despite getting chemotherapy.  Next chemo on Tuesday, 12/4. -Continue with [facility] oncology for further management  Leukocytosis Patient with significant leukocytosis with WBC at 50.  Check care everywhere and per oncology note he was having worsening leukocytosis for a while, there was concern of leukemoid reaction versus any blood dyscrasias which is being investigated. -Continue to monitor -Follow-up with oncology  Ascites, malignant Patient with peritoneal drain in place due to recurrent ascites secondary to his malignancy.  More than 2 L were drained in ED.  Patient does draining every other day. -Continue to drain as needed  History of DVT (deep vein thrombosis) Patient has an history of DVT and found to have pancreatic malignancy while doing hypercoagulability workup. -Continue with home Eliquis  AKI (acute kidney injury) ([place]) Resolved with IV hydration AKI with creatinine of 1.49 on admission.  Baseline seems to be around 1. -Giving some IV fluid -Monitor renal  function -Avoid nephrotoxins  Hyperkalemia Slight improvement of potassium to 5.2, currently borderline high.  Unable to take [facility] due to persistent nausea and vomiting. -Continue to monitor  Lactic acidosis Patient with persistent lactic acidosis despite getting fluid. Most likely secondary to advancement of cancer and persistent nausea and vomiting. -Continue with IV fluid and supportive care  Sinus tachycardia Patient with persistent sinus tachycardia, pain and acidosis both can be contributory. -IV metoprolol as needed   Subjective: Patient seems lethargic but continued to have some pain.  P.o. intake remains very poor.  Intermittent nausea and vomiting, able to tolerate couple of sips here and there.  Physical Exam: Vitals:   07/12/22 0400 07/12/22 0437 07/12/22 0815 07/12/22 1207  BP:  107/72 109/66   Pulse:  (!) 113 (!) 109 (!) 135  Resp: '12 14 13 10  '$ Temp:  (!) 97.5 F (36.4 C) 98 F (36.7 C)   TempSrc:  Oral Oral   SpO2:  98% 96% 95%  Weight:      Height:       General.  Chronically ill-appearing, lethargic gentleman, in no acute distress. Pulmonary.  Lungs clear bilaterally, normal respiratory effort. CV.  Regular rate and rhythm, no JVD, rub or murmur. Abdomen.  Diffusely tender, mildly distended, BS positive, RUQ drain. CNS.  Somnolent.  No focal neurologic deficit. Extremities.  No edema, no cyanosis, pulses intact and symmetrical. Psychiatry.  Judgment and insight appears normal.  Data Reviewed: Prior data reviewed  Family Communication: Discussed with wife at bedside.  Disposition: Status is: Inpatient Remains inpatient appropriate because: Severity of illness  Planned Discharge Destination: Home  DVT prophylaxis.  Eliquis Time spent: 50 minutes  This record has been created using Systems analyst. Errors have been sought and corrected,but may not always be located. Such creation errors do not reflect on the standard of care.    Author: Lorella Nimrod, MD 07/12/2022 12:51 PM  For on call review [email or web address].

## 2022-07-12 NOTE — Significant Event (Signed)
Rapid Response Event Note   Reason for Call :  Lethargy, decreased responsiveness  Initial Focused Assessment:  Rapid response RN arrived in patient's room with patient lying in bed surrounded by [facility] staff and two visitors. Per report from staff and visitors, patient had fluid removed from abdomen (3 bottles of amber fluid that were on the window sill of the room) and then started getting lethargic and difficult to arouse. Patient had been on PCA and PCA pump was beeping due to low RR. At time of arrival patient's eyes were closed and his RR was 5-6. Per staff and visitors, patient had not pressed his PCA button just before lethargy started. Patient was not able to arouse to repeated stimulation at that time. Vital signs showed BP 100/72 MAP 82, HR 128 appeared regular.  Interventions:  Narcan administered per order. Patient woke up very quickly after administration. Patient alert and able to answer questions. Vital signs still show tachycardia in 120-130s, BP 94/82 MAP 87. Dr. Priscella Mann arrived at bedside shortly after administration and perosnally assessed patient and answered questions from family. Patient only complained of some tingling.  Plan of Care:  Patient to remain on [place] for now. Per Ale RN, provider discontinuing the PCA but patient does have other pain medications ordered if needed. Dr. Priscella Mann to touch base with Dr. Reesa Chew about further orders, including potential for albumin administration.  Event Summary:   MD Notified: Dr. Reesa Chew Call Time: 15:44 Arrival Time: 15:45 End Time: 15:57  Melannie Metzner, Jaynie Bream, RN

## 2022-07-13 DIAGNOSIS — C259 Malignant neoplasm of pancreas, unspecified: Secondary | ICD-10-CM

## 2022-07-13 DIAGNOSIS — R180 Malignant ascites: Secondary | ICD-10-CM | POA: Diagnosis not present

## 2022-07-13 DIAGNOSIS — R55 Syncope and collapse: Secondary | ICD-10-CM | POA: Diagnosis not present

## 2022-07-13 DIAGNOSIS — F419 Anxiety disorder, unspecified: Secondary | ICD-10-CM

## 2022-07-13 DIAGNOSIS — R0600 Dyspnea, unspecified: Secondary | ICD-10-CM

## 2022-07-13 DIAGNOSIS — Z515 Encounter for palliative care: Secondary | ICD-10-CM

## 2022-07-13 DIAGNOSIS — G893 Neoplasm related pain (acute) (chronic): Secondary | ICD-10-CM | POA: Diagnosis not present

## 2022-07-13 DIAGNOSIS — Z66 Do not resuscitate: Secondary | ICD-10-CM

## 2022-07-13 DIAGNOSIS — Z7401 Bed confinement status: Secondary | ICD-10-CM | POA: Diagnosis not present

## 2022-07-13 DIAGNOSIS — J690 Pneumonitis due to inhalation of food and vomit: Secondary | ICD-10-CM | POA: Diagnosis not present

## 2022-07-13 DIAGNOSIS — C787 Secondary malignant neoplasm of liver and intrahepatic bile duct: Secondary | ICD-10-CM | POA: Diagnosis not present

## 2022-07-13 LAB — GLUCOSE, CAPILLARY: Glucose-Capillary: 137 mg/dL — ABNORMAL HIGH (ref 70–99)

## 2022-07-13 LAB — LACTIC ACID, PLASMA: Lactic Acid, Venous: 2.1 mmol/L (ref 0.5–1.9)

## 2022-07-13 MED ORDER — HEPARIN SOD (PORK) LOCK FLUSH 100 UNIT/ML IV SOLN
500.0000 [IU] | Freq: Once | INTRAVENOUS | Status: AC
  Administered 2022-07-13: 500 [IU] via INTRAVENOUS
  Filled 2022-07-13: qty 5

## 2022-07-13 MED ORDER — LORAZEPAM 2 MG/ML IJ SOLN
1.0000 mg | Freq: Once | INTRAMUSCULAR | Status: AC
  Administered 2022-07-13: 1 mg via INTRAVENOUS
  Filled 2022-07-13: qty 1

## 2022-07-13 MED ORDER — ACETAMINOPHEN 500 MG PO TABS
1000.0000 mg | ORAL_TABLET | Freq: Once | ORAL | Status: DC
  Filled 2022-07-13: qty 2

## 2022-07-13 MED ORDER — SODIUM CHLORIDE 0.9 % IV SOLN
12.5000 mg | Freq: Once | INTRAVENOUS | Status: AC
  Administered 2022-07-13: 12.5 mg via INTRAVENOUS
  Filled 2022-07-13: qty 0.5

## 2022-07-13 MED ORDER — MORPHINE SULFATE (PF) 2 MG/ML IV SOLN
2.0000 mg | INTRAVENOUS | Status: DC | PRN
  Administered 2022-07-13: 2 mg via INTRAVENOUS
  Filled 2022-07-13: qty 1

## 2022-07-13 MED ORDER — CHLORHEXIDINE GLUCONATE CLOTH 2 % EX PADS: 6.0000 | MEDICATED_PAD | Freq: Every day | CUTANEOUS | Status: DC

## 2022-07-13 MED ORDER — SODIUM CHLORIDE 0.9 % IV SOLN
25.0000 mg | Freq: Four times a day (QID) | INTRAVENOUS | Status: DC | PRN
  Filled 2022-07-13: qty 1

## 2022-07-13 MED ORDER — ALUM & MAG HYDROXIDE-SIMETH 200-200-20 MG/5ML PO SUSP: 30.0000 mL | ORAL | 0 refills | Status: AC | PRN

## 2022-07-13 MED ORDER — LORAZEPAM 2 MG/ML IJ SOLN
1.0000 mg | INTRAMUSCULAR | Status: DC | PRN
  Administered 2022-07-13: 1 mg via INTRAVENOUS
  Filled 2022-07-13: qty 1

## 2022-07-13 MED ORDER — KETOROLAC TROMETHAMINE 15 MG/ML IJ SOLN
15.0000 mg | Freq: Once | INTRAMUSCULAR | Status: AC
  Administered 2022-07-13: 15 mg via INTRAVENOUS
  Filled 2022-07-13: qty 1

## 2022-07-13 MED ORDER — MORPHINE SULFATE (PF) 2 MG/ML IV SOLN: 2.0000 mg | INTRAVENOUS | 0 refills | Status: AC | PRN

## 2022-07-13 NOTE — Progress Notes (Signed)
Reached out to Neomia Glass NP several times for this patient.  Gave patient PO medication at 2130 last night he immediately threw up medication. Patient also having some anxiety,restlessness. Family requesting he take his medications by IV route. The only medication we could switch and him take IV was Zyprexa.  IV Dilaudid given at 2223pm. Patient then went back to red mews with RR being between 9-12 while sleeping. Family asking for pain medication but RR are low already.  Toradol given at 0218 to help with pain.  330am patient pain level is a 5. RR 13 while awake. 432am tylenol given and patient immediately threw up medication.   Wife stating patient was getting morphine and oxy at [facility] and doing well. But we discussed he cannot keep the PO medications down at this point so I can't give those.   Reached out to [facility] to ask about switching Dilaudid to low dose of morphine for patient because he done well with morphine. Valetta Fuller ordered ok to give his dilaudid as long as he is alert. IV dilaudid given at 521am for pain level of a 7/10  Will continue to monitor patient/

## 2022-07-13 NOTE — Discharge Summary (Signed)
Physician Discharge Summary   Patient: Kevin Gregory MRN: [id number] DOB: July 25, 1976  Admit date:     07/10/2022  Discharge date: 07/13/22  Discharge Physician: Lorella Nimrod   PCP: Leonel Ramsay, MD   Recommendations at discharge:  Patient is being discharged to hospice facility for end-of-life care  Discharge Diagnoses: Principal Problem:   Aspiration pneumonia [facility]) Active Problems:   Nausea & vomiting   Cancer related pain   Pancreatic mass   Leukocytosis   Ascites, malignant   History of DVT (deep vein thrombosis)   AKI (acute kidney injury) ([place])   Hyperkalemia   Lactic acidosis   Sinus tachycardia  Resolved Problems:   * No resolved hospital problems. *  Hospital Course: Kevin Gregory is a 46 y.o. male with history of stage IV pancreatic cancer presented to ED with worsening shortness of breath.  Patient has progressively worsening exertional dyspnea with acute onset shortness of breath with persistent vomiting. Patient was also having more pain in his belly with worsening nausea and vomiting. He became short of breath soon after a big vomitus-concern of aspiration. Denies any fever or chills.  Had a drain placed in right side of abdomen for recurrent ascites. Denies any sick contacts.  Discussed with patient about going to [facility] as his care is over there, patient would like to stay at our place to get some management of his nausea, vomiting and aspiration pneumonia and would like to go to cancer center on Tuesday according to his appointment for chemo.  ED course.  Patient was little tachycardic and tachypneic on arrival, saturating 89% which improved to 100% with 2 L of oxygen.  Labs pertinent for WBC at 50.5, hemoglobin 11.3, platelets 541, sodium 130, potassium 5.5, BUN 42, creatinine 1.49.  Alkaline phosphatase at 243.  Troponin negative x 2.  Lactic acid 2.1>>2.2. Blood cultures were drawn. CTA negative for PE but did show dilated esophagus and a  airspace disease/pneumonia in the left more than right lower lobes question underlying aspiration. CT abdomen and pelvis with rapid progression of extensive intra-abdominal malignancy affecting the peritoneum with large volume ascites and diffuse peritoneal nodularity. Soft tissue metastasis is also seen in the left gluteal fat.  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  Patient was given 1 L of bolus , along with cefepime and vancomycin.  12/3: Patient continued to have significant pain with associated tachycardia.  No significant upper respiratory symptoms.  Continued to have nausea and vomiting, tolerating few sips here and there but unable to tolerate a meal.  Making Zofran's as scheduled. Adding home MS Contin 60 mg twice daily to IV Dilaudid every 2 hourly. Explained to the patient and family about the risk of respiratory distress with that much of opioid as he would like to remain full code and full scope of care.  Patient with very poor prognosis.  He would like to be discharged tomorrow afternoon so he can go to [place] on Tuesday morning according to his appointment.  12/4: Patient was started on PCA pump for better pain control. Antibiotics were switched to Augmentin solution to help keeping it down. Potassium with some improvement to 5.2 which is borderline.  Lactic acid remains elevated at 2.4, procalcitonin at 1.03.  Still having some pain but bearable, rates at 6-7/10. Wife is requesting discharge tomorrow morning so they can go directly to cancer center at [facility].  Will ambulate with and without oxygen to determine the oxygen requirement.  12/5: Patient underwent  respiratory distress with Dilaudid PCA pump yesterday late afternoon requiring Narcan.  After that PCA pump was discontinued and he is being managed with IV pushes.  Patient and family decided to proceed with hospice after talking with his oncologist Dr. Berneice Gandy at [facility].  Patient has significant disease  progression and is not a candidate for further chemotherapy.  He also started getting bloody ascitic fluid, had one hematemesis.  Patient was on Eliquis for thromboembolism, which was discontinued. Continued to have some vomiting and unable to tolerate any p.o. Extremely poor functional status and prognosis.  Palliative care and hospice services were consulted and patient is being discharged to hospice facility for end-of-life care.  He wants to go home with hospice but requiring a lot of IV pain medications so family decided to go to hospice facility.  If he becomes stable on a certain pain regimen then they can discharge him home with hospice if appropriate.    Assessment and Plan: * Aspiration pneumonia [facility]) Patient admitted with worsening shortness of breath and concern of aspiration with recurrent vomiting.  Initially requiring 2 L of oxygen with transient hypoxia.  No baseline oxygen use.  Listed as sepsis due to leukocytosis and mild tachycardia. Procalcitonin elevated at 0.86 and chest imaging with concern of aspiration pneumonia.-Received cefepime in and vancomycin in ED. Blood cultures negative in 2 day.  Patient has chronic leukocytosis and severe debilitation due to advanced malignancy, most likely SIRS response.  Sepsis ruled out. -Switch Unasyn with liquid Augmentin-will complete a 5-day course -Aspiration precautions  Nausea & vomiting Secondary to an extensive malignancy Patient with recurrent vomiting and was unable to take much p.o. Most likely the cause of aspiration.  Wife would like at least full liquid meals so she can try giving him some Ensure or boost with Zofran. Most likely secondary to advance pancreatic disease. -Make the Zofran as scheduled to see if that will help -Full liquid diet  Cancer related pain Patient was started on PCA pump with Dilaudid-pain remain at 5-7/10. Appears more lethargic-unfortunately we cannot up the dose as he will remain full code  with full scope of care. Patient is on MS Contin and oxycodone at home. -Restarting home MS Contin 60 mg twice daily -Continue with Dilaudid PCA   Pancreatic mass Patient with history of stage IV pancreatic cancer with extensive mets involving liver and peritoneum.  Seems like having disease progression despite getting chemotherapy.  Next chemo on Tuesday, 12/4. -Continue with [facility] oncology for further management  Leukocytosis Patient with significant leukocytosis with WBC at 50.  Check care everywhere and per oncology note he was having worsening leukocytosis for a while, there was concern of leukemoid reaction versus any blood dyscrasias which is being investigated. -Continue to monitor -Follow-up with oncology  Ascites, malignant Patient with peritoneal drain in place due to recurrent ascites secondary to his malignancy.  More than 2 L were drained in ED.  Patient does draining every other day. -Continue to drain as needed  History of DVT (deep vein thrombosis) Patient has an history of DVT and found to have pancreatic malignancy while doing hypercoagulability workup. -Continue with home Eliquis  AKI (acute kidney injury) ([place]) Resolved with IV hydration AKI with creatinine of 1.49 on admission.  Baseline seems to be around 1. -Giving some IV fluid -Monitor renal function -Avoid nephrotoxins  Hyperkalemia Slight improvement of potassium to 5.2, currently borderline high.  Unable to take [facility] due to persistent nausea and vomiting. -Continue to monitor  Lactic  acidosis Patient with persistent lactic acidosis despite getting fluid. Most likely secondary to advancement of cancer and persistent nausea and vomiting. -Continue with IV fluid and supportive care  Sinus tachycardia Patient with persistent sinus tachycardia, pain and acidosis both can be contributory. -IV metoprolol as needed    Consultants: Palliative care Procedures performed: None Disposition: Hospice  care Diet recommendation:  Discharge Diet Orders (From admission, onward)     Start     Ordered   07/13/22 0000  Diet - low sodium heart healthy        07/13/22 1435           Regular diet DISCHARGE MEDICATION: Allergies as of 07/13/2022       Reactions   Oxycodone Rash        Medication List     STOP taking these medications    Eliquis 5 MG Tabs tablet Generic drug: apixaban   furosemide 20 MG tablet Commonly known as: LASIX   lactulose 10 GM/15ML solution Commonly known as: CHRONULAC   methylphenidate 5 MG tablet Commonly known as: RITALIN   morphine 30 MG 12 hr tablet Commonly known as: MS CONTIN   morphine 60 MG 12 hr tablet Commonly known as: MS CONTIN Replaced by: morphine (PF) 2 MG/ML injection   OLANZapine 5 MG tablet Commonly known as: ZYPREXA   ondansetron 8 MG tablet Commonly known as: ZOFRAN   pantoprazole 40 MG tablet Commonly known as: PROTONIX   prochlorperazine 10 MG tablet Commonly known as: COMPAZINE   spironolactone 25 MG tablet Commonly known as: ALDACTONE   sulfamethoxazole-trimethoprim 200-40 MG/5ML suspension Commonly known as: BACTRIM   tadalafil 20 MG tablet Commonly known as: CIALIS   traMADol 50 MG tablet Commonly known as: ULTRAM       TAKE these medications    alum & mag hydroxide-simeth 200-200-20 MG/5ML suspension Commonly known as: MAALOX/MYLANTA Take 30 mLs by mouth every 4 (four) hours as needed for indigestion or heartburn.   baclofen 10 MG tablet Commonly known as: LIORESAL Take 10 mg by mouth 3 (three) times daily.   gabapentin 100 MG capsule Commonly known as: NEURONTIN Take 100 mg by mouth 3 (three) times daily.   morphine (PF) 2 MG/ML injection Inject 1 mL (2 mg total) into the vein every 2 (two) hours as needed. Replaces: morphine 60 MG 12 hr tablet   Oxycodone HCl 10 MG Tabs Take 1-2 tablets by mouth every 4 (four) hours as needed. What changed: Another medication with the same name  was removed. Continue taking this medication, and follow the directions you see here.   Pancrelipase (Lip-Prot-Amyl) 24000-76000 units Cpep Take 2 capsules by mouth 4 (four) times daily -  before meals and at bedtime. Take 2 capsules by mouth 3 (three) times daily with meals And 1 with snacks        Discharge Exam: Filed Weights   07/10/22 0430  Weight: 77.1 kg   General.  Frail gentleman, in no acute distress. Pulmonary.  Lungs clear bilaterally, normal respiratory effort. CV.  Regular rate and rhythm, no JVD, rub or murmur. Abdomen.  Diffusely tender, mildly distended with right-sided paracentesis drain in place. CNS.  Alert and oriented .  No focal neurologic deficit. Extremities.  No edema, no cyanosis, pulses intact and symmetrical. Psychiatry.  Judgment and insight appears normal.   Condition at discharge: poor  The results of significant diagnostics from this hospitalization (including imaging, microbiology, ancillary and laboratory) are listed below for reference.   Imaging Studies:  DG Abd 1 View  Result Date: 07/12/2022 CLINICAL DATA:  Constipation EXAM: ABDOMEN - 1 VIEW COMPARISON:  CT done on 07/10/2022 FINDINGS: There is moderate gaseous distention of colon, more so in the right colon. Small amount of stool is seen in colon. There is no fecal impaction in rectosigmoid. There is a peritoneal drain with its tip in left lower quadrant. Surgical clips are seen in upper abdomen and pelvis. Stomach is not distended. There is no dilation of small-bowel loops. There are patchy infiltrates in the visualized lower lung fields suggesting atelectasis/pneumonia, more so on the left side. IMPRESSION: There is no evidence of small-bowel obstruction or pneumoperitoneum. There is moderate dilation of colon which may suggest ileus. There is small stool burden in colon. There are patchy infiltrates in both lower lung fields suggesting atelectasis/pneumonia. Tip of peritoneal drain is seen in  left lower quadrant of abdomen. Electronically Signed   By: Elmer Picker M.D.   On: 07/12/2022 14:15   CT Angio Chest PE W and/or Wo Contrast  Result Date: 07/10/2022 CLINICAL DATA:  Sepsis.  Stage IV pancreas cancer EXAM: CT ANGIOGRAPHY CHEST CT ABDOMEN AND PELVIS WITH CONTRAST TECHNIQUE: Multidetector CT imaging of the chest was performed using the standard protocol during bolus administration of intravenous contrast. Multiplanar CT image reconstructions and MIPs were obtained to evaluate the vascular anatomy. Multidetector CT imaging of the abdomen and pelvis was performed using the standard protocol during bolus administration of intravenous contrast. RADIATION DOSE REDUCTION: This exam was performed according to the departmental dose-optimization program which includes automated exposure control, adjustment of the mA and/or kV according to patient size and/or use of iterative reconstruction technique. CONTRAST:  171m OMNIPAQUE IOHEXOL 350 MG/ML SOLN COMPARISON:  04/30/2022 FINDINGS: CTA CHEST FINDINGS Cardiovascular: Suboptimal opacification of the pulmonary arteries to the segmental level. No evidence of pulmonary embolism. Normal heart size. No pericardial effusion. Mediastinum/Nodes: Fluid distended esophagus which is circumferentially thickened. No adenopathy Lungs/Pleura: Airspace disease in the left more than right lower lobes with mild volume loss on the left. No edema, effusion, or air leak. Musculoskeletal: No acute or aggressive finding Review of the MIP images confirms the above findings. CT ABDOMEN and PELVIS FINDINGS Hepatobiliary: Numerous hepatic metastases. Perihepatic fluid loculation attributed to peritoneal carcinomatosis. Clips along the left lobe of the liver/omentum. Full gallbladder without stone or ductal dilatation. Pancreas: Hypoenhancing pancreatic tail mass indistinguishable from splenic hilar mass. Chronic splenic vein occlusion. Spleen: Extensive replacement by tumor  especially inferiorly Adrenals/Urinary Tract: Negative adrenals, kidney, and bladder. Stomach/Bowel: Negative for bowel obstruction. Distortion of bowel loops from peritoneal carcinomatosis. Vascular/Lymphatic: Peritoneal carcinomatosis with diffuse lobulated thickening of the peritoneal reflections and thickening of the omentum. Ascites is large volume. Thickest tumor is superiorly. A peritoneal drain from the right extends with tip in the left lower gutter. There is chronic splenic vein occlusion with short gastric collaterals at the proximal stomach. Reproductive: Negative Other: Soft tissue nodule in the subcutaneous left gluteal fat measuring 17 mm. Subcutaneous and intramuscular nodules in the bilateral upper abdominal wall, likely spreading through port sites. Musculoskeletal: No acute finding Review of the MIP images confirms the above findings. IMPRESSION: Chest CT: 1. Suboptimal pulmonary artery opacification. No evidence of pulmonary emboli. 2. Airspace disease/pneumonia in the left more than right lower lobes, question underlying aspiration given the fluid dilated esophagus. Abdominal CT: 1. Rapid progression of extensive intra-abdominal malignancy especially affecting the peritoneum with large volume ascites and diffuse peritoneal nodularity. Peritoneal tumor growth through the abdominal wall into  the subcutaneous fat, presumably at interval port sites. Soft tissue metastasis is also seen in the left gluteal fat. 2. 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 Electronically Signed   By: Jorje Guild M.D.   On: 07/10/2022 09:20   CT ABDOMEN PELVIS W CONTRAST  Result Date: 07/10/2022 CLINICAL DATA:  Sepsis.  Stage IV pancreas cancer EXAM: CT ANGIOGRAPHY CHEST CT ABDOMEN AND PELVIS WITH CONTRAST TECHNIQUE: Multidetector CT imaging of the chest was performed using the standard protocol during bolus administration of intravenous contrast. Multiplanar CT image reconstructions and MIPs  were obtained to evaluate the vascular anatomy. Multidetector CT imaging of the abdomen and pelvis was performed using the standard protocol during bolus administration of intravenous contrast. RADIATION DOSE REDUCTION: This exam was performed according to the departmental dose-optimization program which includes automated exposure control, adjustment of the mA and/or kV according to patient size and/or use of iterative reconstruction technique. CONTRAST:  124m OMNIPAQUE IOHEXOL 350 MG/ML SOLN COMPARISON:  04/30/2022 FINDINGS: CTA CHEST FINDINGS Cardiovascular: Suboptimal opacification of the pulmonary arteries to the segmental level. No evidence of pulmonary embolism. Normal heart size. No pericardial effusion. Mediastinum/Nodes: Fluid distended esophagus which is circumferentially thickened. No adenopathy Lungs/Pleura: Airspace disease in the left more than right lower lobes with mild volume loss on the left. No edema, effusion, or air leak. Musculoskeletal: No acute or aggressive finding Review of the MIP images confirms the above findings. CT ABDOMEN and PELVIS FINDINGS Hepatobiliary: Numerous hepatic metastases. Perihepatic fluid loculation attributed to peritoneal carcinomatosis. Clips along the left lobe of the liver/omentum. Full gallbladder without stone or ductal dilatation. Pancreas: Hypoenhancing pancreatic tail mass indistinguishable from splenic hilar mass. Chronic splenic vein occlusion. Spleen: Extensive replacement by tumor especially inferiorly Adrenals/Urinary Tract: Negative adrenals, kidney, and bladder. Stomach/Bowel: Negative for bowel obstruction. Distortion of bowel loops from peritoneal carcinomatosis. Vascular/Lymphatic: Peritoneal carcinomatosis with diffuse lobulated thickening of the peritoneal reflections and thickening of the omentum. Ascites is large volume. Thickest tumor is superiorly. A peritoneal drain from the right extends with tip in the left lower gutter. There is chronic  splenic vein occlusion with short gastric collaterals at the proximal stomach. Reproductive: Negative Other: Soft tissue nodule in the subcutaneous left gluteal fat measuring 17 mm. Subcutaneous and intramuscular nodules in the bilateral upper abdominal wall, likely spreading through port sites. Musculoskeletal: No acute finding Review of the MIP images confirms the above findings. IMPRESSION: Chest CT: 1. Suboptimal pulmonary artery opacification. No evidence of pulmonary emboli. 2. Airspace disease/pneumonia in the left more than right lower lobes, question underlying aspiration given the fluid dilated esophagus. Abdominal CT: 1. Rapid progression of extensive intra-abdominal malignancy especially affecting the peritoneum with large volume ascites and diffuse peritoneal nodularity. Peritoneal tumor growth through the abdominal wall into the subcutaneous fat, presumably at interval port sites. Soft tissue metastasis is also seen in the left gluteal fat. 2. 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 Electronically Signed   By: JJorje GuildM.D.   On: 07/10/2022 09:20   DG Chest 2 View  Result Date: 07/10/2022 CLINICAL DATA:  Shortness of breath, hypoxia and tachycardia. EXAM: CHEST - 2 VIEW COMPARISON:  CTA chest 04/30/2022 FINDINGS: The lungs are expiratory. There are atelectatic bands in the lung bases and limited view of the lower zones with no convincing evidence of pneumonia. No pleural effusion is seen. The mediastinum is normally outlined. Right chest port with IJ approach catheter newly noted. The catheter tip is in the distal SVC. Mild thoracic dextroscoliosis.  IMPRESSION: Expiratory chest x-ray with atelectatic bands in the lung bases. No convincing infiltrate. Limited view of the lower zones. Electronically Signed   By: Telford Nab M.D.   On: 07/10/2022 05:15    Microbiology: Results for orders placed or performed during the hospital encounter of 07/10/22  Resp Panel by  RT-PCR (Flu A&B, Covid) Anterior Nasal Swab     Status: None   Collection Time: 07/10/22  6:11 AM   Specimen: Anterior Nasal Swab  Result Value Ref Range Status   SARS Coronavirus 2 by RT PCR NEGATIVE NEGATIVE Final    Comment: (NOTE) SARS-CoV-2 target nucleic acids are NOT DETECTED.  The SARS-CoV-2 RNA is generally detectable in upper respiratory specimens during the acute phase of infection. The lowest concentration of SARS-CoV-2 viral copies this assay can detect is 138 copies/mL. A negative result does not preclude SARS-Cov-2 infection and should not be used as the sole basis for treatment or other patient management decisions. A negative result may occur with  improper specimen collection/handling, submission of specimen other than nasopharyngeal swab, presence of viral mutation(s) within the areas targeted by this assay, and inadequate number of viral copies(<138 copies/mL). A negative result must be combined with clinical observations, patient history, and epidemiological information. The expected result is Negative.  Fact Sheet for Patients:  EntrepreneurPulse.com.au  Fact Sheet for Healthcare Providers:  IncredibleEmployment.be  This test is no t yet approved or cleared by the [place] FDA and  has been authorized for detection and/or diagnosis of SARS-CoV-2 by FDA under an Emergency Use Authorization (EUA). This EUA will remain  in effect (meaning this test can be used) for the duration of the COVID-19 declaration under Section 564(b)(1) of the Act, 21 U.S.C.section 360bbb-3(b)(1), unless the authorization is terminated  or revoked sooner.       Influenza A by PCR NEGATIVE NEGATIVE Final   Influenza B by PCR NEGATIVE NEGATIVE Final    Comment: (NOTE) The Xpert Xpress SARS-CoV-2/FLU/RSV plus assay is intended as an aid in the diagnosis of influenza from Nasopharyngeal swab specimens and should not be used as a sole basis for  treatment. Nasal washings and aspirates are unacceptable for Xpert Xpress SARS-CoV-2/FLU/RSV testing.  Fact Sheet for Patients: EntrepreneurPulse.com.au  Fact Sheet for Healthcare Providers: IncredibleEmployment.be  This test is not yet approved or cleared by the [place] FDA and has been authorized for detection and/or diagnosis of SARS-CoV-2 by FDA under an Emergency Use Authorization (EUA). This EUA will remain in effect (meaning this test can be used) for the duration of the COVID-19 declaration under Section 564(b)(1) of the Act, 21 U.S.C. section 360bbb-3(b)(1), unless the authorization is terminated or revoked.  Performed at [facility], [address] [place], [place] [address]   Blood culture (routine x 2)     Status: None (Preliminary result)   Collection Time: 07/10/22  6:39 AM   Specimen: BLOOD RIGHT HAND  Result Value Ref Range Status   Specimen Description BLOOD RIGHT HAND  Final   Special Requests   Final    Blood Culture results may not be optimal due to an inadequate volume of blood received in culture bottles   Culture   Final    NO GROWTH 3 DAYS Performed at [facility], [address], [place], [place] [address]    Report Status PENDING  Incomplete  Blood culture (routine x 2)     Status: None (Preliminary result)   Collection Time: 07/10/22  9:06 AM  Specimen: BLOOD  Result Value Ref Range Status   Specimen Description BLOOD RIGHT [facility]  Final   Special Requests   Final    BOTTLES DRAWN AEROBIC AND ANAEROBIC Blood Culture adequate volume   Culture   Final    NO GROWTH 3 DAYS Performed at [facility], [address], [place], [place] [address]    Report Status PENDING  Incomplete    Labs: CBC: Recent Labs  Lab 07/10/22 0639 07/11/22 0523  WBC 50.5* 49.2*  NEUTROABS 43.7*  --   HGB 11.3* 10.8*  HCT 35.6* 33.3*  MCV 86.8 85.6  PLT 541* 026*   Basic Metabolic  Panel: Recent Labs  Lab 07/10/22 0639 07/11/22 0523 07/12/22 0357  NA 130* 132* 132*  K 5.5* 5.5* 5.2*  CL 96* 98 97*  CO2 '24 26 27  '$ GLUCOSE 125* 125* 120*  BUN 42* 35* 41*  CREATININE 1.49* 1.07 1.22  CALCIUM 8.0* 8.3* 8.1*  MG 2.4  --   --    Liver Function Tests: Recent Labs  Lab 07/10/22 0639 07/11/22 0523  AST 27 25  ALT 21 17  ALKPHOS 243* 236*  BILITOT 1.0 1.0  PROT 5.6* 5.1*  ALBUMIN 2.2* 1.9*   CBG: Recent Labs  Lab 07/11/22 1508 07/12/22 1544 07/13/22 1327  GLUCAP 150* 128* 137*    Discharge time spent: greater than 30 minutes.  This record has been created using Systems analyst. Errors have been sought and corrected,but may not always be located. Such creation errors do not reflect on the standard of care.  Today  Signed: Lorella Nimrod, MD [facility] Hospitalists 07/13/2022

## 2022-07-13 NOTE — Plan of Care (Signed)
  Problem: Pain Managment: Goal: General experience of comfort will improve Outcome: Progressing   Problem: Safety: Goal: Ability to remain free from injury will improve Outcome: Progressing   Problem: Skin Integrity: Goal: Risk for impaired skin integrity will decrease Outcome: Progressing   

## 2022-07-13 NOTE — Progress Notes (Addendum)
AVS, social worker's paperwork, and signed gold DNR form placed in discharge packet. Report called and given to Mateo Flow, Therapist, sports at [facility]. PIV and right port remain as requested. All questions answered to satisfaction. Awaiting EMS to transport pt with all belongings.

## 2022-07-13 NOTE — Progress Notes (Signed)
Wife drained 1000cc bright red blood from Pleurex and states it has never been this color before. RR 9 breaths per minute at this time. Pt arousable and denies pain at this time. Lorella Nimrod, MD paged and to come to bedside. Minette Brine, [employer] charge nurse and ICU charge nurse made aware.

## 2022-07-13 NOTE — Consult Note (Signed)
Consultation Note Date: 07/13/2022   Patient Name: Kevin Gregory  DOB: 13-May-1976  MRN: [id number]  Age / Sex: 46 y.o., male  PCP: Leonel Ramsay, MD Referring Physician: Lorella Nimrod, MD  Reason for Consultation: Establishing goals of care, Pain control, and Psychosocial/spiritual support  HPI/Patient Profile: 46 y.o. male   admitted on 07/10/2022 with   history of stage IV pancreatic cancer presented to ED with worsening shortness of breath.  Patient has progressively worsening exertional dyspnea with acute onset shortness of breath with persistent vomiting. Patient was also having more pain in his belly with worsening nausea and vomiting.  Had a drain placed in right side of abdomen for recurrent ascites.  Patient receives cancer care at [facility]  He became short of breath soon after a big vomitus-concern of aspiration.   ED course.  Patient was little tachycardic and tachypneic on arrival, saturating 89% which improved to 100% with 2 L of oxygen.  Labs pertinent for WBC at 50.5, hemoglobin 11.3, platelets 541, sodium 130, potassium 5.5, BUN 42, creatinine 1.49.  Alkaline phosphatase at 243.  Troponin negative x 2.  Lactic acid 2.1>>2.2.  CTA negative for PE but did show dilated esophagus and a airspace disease/pneumonia in the left more than right lower lobes question underlying aspiration.  CT abdomen and pelvis with rapid progression of extensive intra-abdominal malignancy affecting the peritoneum with large volume ascites and diffuse peritoneal nodularity. Soft tissue metastasis is also seen in the left gluteal fat.  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   Patient with very poor prognosis.   Patient and family had video conference with oncologist from [facility] today.  Recommendation made for hospice care.      Clinical Assessment and Goals of Care:  This NP Wadie Lessen  reviewed medical records, received report from team, assessed the patient and then meet at the patient's bedside along with his wife, mother, son and friend to discuss diagnosis, prognosis, GOC, EOL wishes disposition and options.   Concept of Palliative Care was introduced as specialized medical care for people and their families living with serious illness.  If focuses on providing relief from the symptoms and stress of a serious illness.  The goal is to improve quality of life for both the patient and the family.Values and goals of care important to patient and family were attempted to be elicited.  Created space and opportunity for patient  and family to explore thoughts and feelings regarding current medical situation.  At this time having had the opportunity to communicate with oncology at [facility] patient and family all understand the seriousness of the current medical situation and the limited prognosis.  Initially patient was requesting to discharge home ASAP but with the heavy care load and pain management needs decision was made to transition to residential hospice hopefully for stabilization and ultimately return home if possible     The difference between a aggressive medical intervention path  and a palliative comfort care path for this patient at this time was  had.      Education offered on hospice benefit; philosophy and eligibility.  Education offered on grief and bereavement specific to patient's 55 year old autistic son.  Encouraged family to seek support through grief counseling and area hospice.  Education offered on symptom management strategies specific to pain and agitation   Natural trajectory and expectations at EOL were discussed.    Questions and concerns addressed.     PMT will continue to support holistically.          No documented H POA or advanced care planning documents.  Wife is next of kin       SUMMARY OF RECOMMENDATIONS    Code Status/Advance Care  Planning: DNR-documented today   Symptom Management:  Pain: Dilaudid IV 1 mg every 2 hours as needed for pain Anxiety: Ativan 1 mg IV every 4 hours  Palliative Prophylaxis:  Frequent Pain Assessment and Oral Care  Additional Recommendations (Limitations, Scope, Preferences): Full Comfort Care  Psycho-social/Spiritual:  Desire for further Chaplaincy support:no Additional Recommendations: Grief/Bereavement Support  Prognosis:  Hours - Days  Discharge Planning: Hospice facility      Primary Diagnoses: Present on Admission:  Aspiration pneumonia (HCC)  Pancreatic mass  Nausea & vomiting  Cancer related pain  Leukocytosis  Ascites, malignant  Hyperkalemia  AKI (acute kidney injury) ([place])   I have reviewed the medical record, interviewed the patient and family, and examined the patient. The following aspects are pertinent.  Past Medical History:  Diagnosis Date   Erectile dysfunction 12/01/2011   GERD (gastroesophageal reflux disease)    Kidney stones    Nephrolithiasis    Social History   Socioeconomic History   Marital status: Married    Spouse name: Not on file   Number of children: Not on file   Years of education: 12   Highest education level: Not on file  Occupational History   Occupation: Designer, industrial/product  Tobacco Use   Smoking status: Former    Types: Cigarettes    Quit date: 05/07/2013    Years since quitting: 9.1   Smokeless tobacco: Never  Substance and Sexual Activity   Alcohol use: Yes    Comment: rare   Drug use: No   Sexual activity: Yes  Other Topics Concern   Not on file  Social History Web designer. Non-smoker- quit 10 years;Alcohol- none; NO drugs. Lives in [place]; with wife; and son 23y-autistic.    Social Determinants of Health   Financial Resource Strain: Not on file  Food Insecurity: Not on file  Transportation Needs: No Transportation Needs (05/07/2022)   PRAPARE - Radiographer, therapeutic (Medical): No    Lack of Transportation (Non-Medical): No  Physical Activity: Not on file  Stress: Not on file  Social Connections: Not on file   Family History  Problem Relation Age of Onset   Leukemia Maternal Uncle    Drug abuse Maternal Grandmother    Scheduled Meds:  acetaminophen  1,000 mg Oral Once   amoxicillin-clavulanate  875 mg Oral Q12H   apixaban  5 mg Oral BID   baclofen  10 mg Oral TID   [START ON 07/14/2022] Chlorhexidine Gluconate Cloth  6 each Topical Daily   gabapentin  100 mg Oral TID   lipase/protease/amylase  24,000 Units Oral TID AC & HS   OLANZapine  5 mg Oral QHS   ondansetron  4 mg Oral Q6H   Or   ondansetron (ZOFRAN) IV  4 mg Intravenous  Q6H   pantoprazole  40 mg Oral Daily   sodium zirconium cyclosilicate  10 g Oral Daily   Continuous Infusions:  lactated ringers 100 mL/hr at 07/13/22 0539   promethazine (PHENERGAN) injection (IM or IVPB)     PRN Meds:.alum & mag hydroxide-simeth, HYDROmorphone (DILAUDID) injection, lactulose, metoprolol tartrate, morphine injection, promethazine (PHENERGAN) injection (IM or IVPB) Medications Prior to Admission:  Prior to Admission medications   Medication Sig Start Date End Date Taking? Authorizing Provider  baclofen (LIORESAL) 10 MG tablet Take 10 mg by mouth 3 (three) times daily. 06/17/22 08/16/22 Yes [provider]  ELIQUIS 5 MG TABS tablet Take by mouth. 04/30/22  Yes [provider]  furosemide (LASIX) 20 MG tablet Take 20 mg by mouth daily. 06/22/22 06/22/23 Yes [provider]  gabapentin (NEURONTIN) 100 MG capsule Take 100 mg by mouth 3 (three) times daily. 06/17/22  Yes [provider]  lactulose (CHRONULAC) 10 GM/15ML solution Take 15-30 mLs by mouth 3 (three) times daily as needed. 06/18/22  Yes [provider]  morphine (MS CONTIN) 60 MG 12 hr tablet Take 60 mg by mouth every 12 (twelve) hours. 07/06/22 08/05/22 Yes [provider]   OLANZapine (ZYPREXA) 5 MG tablet Take 5 mg by mouth at bedtime. 07/05/22 08/04/22 Yes [provider]  Oxycodone HCl 10 MG TABS Take 1-2 tablets by mouth every 4 (four) hours as needed. 06/18/22 07/18/22 Yes [provider]  Pancrelipase, Lip-Prot-Amyl, 24000-76000 units CPEP Take 2 capsules by mouth 4 (four) times daily -  before meals and at bedtime. Take 2 capsules by mouth 3 (three) times daily with meals And 1 with snacks 06/08/22 06/08/23 Yes [provider]  pantoprazole (PROTONIX) 40 MG tablet Take 40 mg by mouth daily. 06/17/22  Yes [provider]  prochlorperazine (COMPAZINE) 10 MG tablet Take 10 mg by mouth every 6 (six) hours as needed. 06/03/22  Yes [provider]  spironolactone (ALDACTONE) 25 MG tablet Take 1 tablet by mouth 2 (two) times daily. 06/22/22 06/22/23 Yes [provider]  sulfamethoxazole-trimethoprim (BACTRIM) 200-40 MG/5ML suspension Take 20 mLs by mouth daily. Take 20 mLs (160 mg of trimethoprim total) by mouth once daily for 30 days Begin in the evening of discharge then take daily 06/30/22 07/30/22 Yes [provider]  methylphenidate (RITALIN) 5 MG tablet Take 5 mg by mouth 2 (two) times daily. Patient not taking: Reported on 07/10/2022 06/20/22   [provider]  morphine (MS CONTIN) 30 MG 12 hr tablet Take 30 mg by mouth every 12 (twelve) hours. Patient not taking: Reported on 07/10/2022 06/08/22   [provider]  ondansetron (ZOFRAN) 8 MG tablet Take 8 mg by mouth 2 (two) times daily.    [provider]  oxyCODONE (OXY IR/ROXICODONE) 5 MG immediate release tablet Take 5 mg by mouth every 4 (four) hours as needed. Patient not taking: Reported on 07/10/2022 05/30/22   [provider]  tadalafil (CIALIS) 20 MG tablet TAKE 0.5-1 TABLETS (10-20 MG TOTAL) BY MOUTH EVERY OTHER DAY AS NEEDED FOR ERECTILE DYSFUNCTION. 01/29/22   Biagio Borg, MD  traMADol (ULTRAM) 50 MG tablet  Take 50 mg by mouth every 6 (six) hours as needed. Patient not taking: Reported on 07/10/2022 05/26/22   [provider]   Allergies  Allergen Reactions   Oxycodone Rash   Review of Systems  Gastrointestinal:  Positive for abdominal distention and abdominal pain.    Physical Exam Constitutional:      Appearance:  He is cachectic. He is ill-appearing.     Interventions: Nasal cannula in place.  Cardiovascular:     Rate and Rhythm: Tachycardia present.  Pulmonary:     Effort: Pulmonary effort is normal.  Abdominal:     General: There is distension.     Tenderness: There is abdominal tenderness.  Skin:    General: Skin is warm and dry.  Neurological:     Mental Status: He is lethargic.     Vital Signs: BP 104/79 (BP Location: Right Arm)   Pulse (!) 121   Temp 97.7 F (36.5 C)   Resp 16   Ht '5\' 10"'$  (1.778 m)   Wt 77.1 kg   SpO2 100%   BMI 24.39 kg/m  Pain Scale: 0-10 POSS *See Group Information*: 1-Acceptable,Awake and alert Pain Score: 0-No pain   SpO2: SpO2: 100 % O2 Device:SpO2: 100 % O2 Flow Rate: .O2 Flow Rate (L/min): 2 L/min  IO: Intake/output summary:  Intake/Output Summary (Last 24 hours) at 07/13/2022 1134 Last data filed at 07/13/2022 2353 Gross per 24 hour  Intake 2236.84 ml  Output 3000 ml  Net -763.16 ml    LBM:   Baseline Weight: Weight: 77.1 kg Most recent weight: Weight: 77.1 kg     Palliative Assessment/Data: 30 % at best    Discussed with Dr Reesa Chew, hospice liaison, RN  Time In: 1200 Time Out: 1345 Time Total: 105 minutes Greater than 50%  of this time was spent counseling and coordinating care related to the above assessment and plan.  Signed by: Wadie Lessen, NP   Please contact Palliative Medicine Team phone at [phone] for questions and concerns.  For individual provider: See Shea Evans

## 2022-07-13 NOTE — Progress Notes (Signed)
Pt vomited stool mixed with blood. PRN Phenergan admin. Lorella Nimrod, MD paged and to come to bedside. Stanton Kidney, NP with palliative care at bedside.

## 2022-07-13 NOTE — Progress Notes (Signed)
[facility]) Hospice hospital liaison note   Referral received for Hospice Home, met with patient and family at bedside. Patient has been approved.    Hospice home is able to accept patient this afternoon after 4   RN staff, you may call report at any time to [phone], room is assigned when report is called.  Please leave IV intact.    Please send completed DNR with patient.   Updated attending and [facility] manager via [place]. Thank you for the opportunity to participate in this patient's care Jhonnie Garner, BSN, RN Hospice nurse liaison [phone]

## 2022-07-14 LAB — URINE CULTURE: Culture: NO GROWTH

## 2022-07-15 LAB — CULTURE, BLOOD (ROUTINE X 2)
Culture: NO GROWTH
Culture: NO GROWTH
Special Requests: ADEQUATE

## 2022-08-09 DEATH — deceased
